# Patient Record
Sex: Female | Born: 1990
Health system: Southern US, Community
[De-identification: ages and names within clinical notes are randomized; demographics above are authoritative.]

## PROBLEM LIST (undated history)

## (undated) DIAGNOSIS — E079 Disorder of thyroid, unspecified: Secondary | ICD-10-CM

## (undated) DIAGNOSIS — F419 Anxiety disorder, unspecified: Secondary | ICD-10-CM

## (undated) DIAGNOSIS — K219 Gastro-esophageal reflux disease without esophagitis: Secondary | ICD-10-CM

## (undated) DIAGNOSIS — R0602 Shortness of breath: Secondary | ICD-10-CM

## (undated) HISTORY — DX: Shortness of breath: R06.02

## (undated) HISTORY — DX: Gastro-esophageal reflux disease without esophagitis: K21.9

## (undated) HISTORY — DX: Disorder of thyroid, unspecified: E07.9

## (undated) HISTORY — DX: Anxiety disorder, unspecified: F41.9

---

## 2020-03-06 ENCOUNTER — Emergency Department
Admission: EM | Admit: 2020-03-06 | Discharge: 2020-03-06 | Disposition: A | Discharge status: Home / Self Care | Payer: BC Managed Care – PPO | Attending: Emergency Medicine | Admitting: Emergency Medicine

## 2020-03-06 ENCOUNTER — Other Ambulatory Visit: Payer: Self-pay

## 2020-03-06 ENCOUNTER — Emergency Department: Payer: BC Managed Care – PPO

## 2020-03-06 ENCOUNTER — Encounter: Payer: Self-pay | Admitting: Emergency Medicine

## 2020-03-06 DIAGNOSIS — U071 COVID-19: Secondary | ICD-10-CM | POA: Diagnosis not present

## 2020-03-06 DIAGNOSIS — J392 Other diseases of pharynx: Secondary | ICD-10-CM | POA: Diagnosis not present

## 2020-03-06 DIAGNOSIS — R059 Cough, unspecified: Secondary | ICD-10-CM | POA: Diagnosis not present

## 2020-03-06 DIAGNOSIS — J351 Hypertrophy of tonsils: Secondary | ICD-10-CM | POA: Diagnosis not present

## 2020-03-06 DIAGNOSIS — J029 Acute pharyngitis, unspecified: Secondary | ICD-10-CM | POA: Diagnosis not present

## 2020-03-06 DIAGNOSIS — J384 Edema of larynx: Secondary | ICD-10-CM | POA: Diagnosis not present

## 2020-03-06 LAB — CBC WITH DIFFERENTIAL/PLATELET
Abs Immature Granulocytes: 0.03 10*3/uL (ref 0.00–0.07)
Basophils Absolute: 0 10*3/uL (ref 0.0–0.1)
Basophils Relative: 0 %
Eosinophils Absolute: 0 10*3/uL (ref 0.0–0.5)
Eosinophils Relative: 0 %
HCT: 40.9 % (ref 36.0–46.0)
Hemoglobin: 13.9 g/dL (ref 12.0–15.0)
Immature Granulocytes: 0 %
Lymphocytes Relative: 21 %
Lymphs Abs: 1.8 10*3/uL (ref 0.7–4.0)
MCH: 27.4 pg (ref 26.0–34.0)
MCHC: 34 g/dL (ref 30.0–36.0)
MCV: 80.5 fL (ref 80.0–100.0)
Monocytes Absolute: 0.9 10*3/uL (ref 0.1–1.0)
Monocytes Relative: 11 %
Neutro Abs: 5.7 10*3/uL (ref 1.7–7.7)
Neutrophils Relative %: 68 %
Platelets: 300 10*3/uL (ref 150–400)
RBC: 5.08 MIL/uL (ref 3.87–5.11)
RDW: 12.9 % (ref 11.5–15.5)
WBC: 8.4 10*3/uL (ref 4.0–10.5)
nRBC: 0 % (ref 0.0–0.2)

## 2020-03-06 LAB — COMPREHENSIVE METABOLIC PANEL
ALT: 43 U/L (ref 0–44)
AST: 41 U/L (ref 15–41)
Albumin: 3.8 g/dL (ref 3.5–5.0)
Alkaline Phosphatase: 97 U/L (ref 38–126)
Anion gap: 15 (ref 5–15)
BUN: 10 mg/dL (ref 6–20)
CO2: 21 mmol/L — ABNORMAL LOW (ref 22–32)
Calcium: 9.3 mg/dL (ref 8.9–10.3)
Chloride: 102 mmol/L (ref 98–111)
Creatinine, Ser: 0.53 mg/dL (ref 0.44–1.00)
GFR, Estimated: >60 mL/min (ref >60)
Glucose, Bld: 91 mg/dL (ref 70–99)
Potassium: 3.4 mmol/L — ABNORMAL LOW (ref 3.5–5.1)
Sodium: 138 mmol/L (ref 135–145)
Total Bilirubin: 0.6 mg/dL (ref 0.3–1.2)
Total Protein: 8.3 g/dL — ABNORMAL HIGH (ref 6.5–8.1)

## 2020-03-06 LAB — RESP PANEL BY RT-PCR (FLU A&B, COVID) ARPGX2
Influenza A by PCR: NEGATIVE
Influenza B by PCR: NEGATIVE
SARS Coronavirus 2 by RT PCR: POSITIVE — AB

## 2020-03-06 LAB — GROUP A STREP BY PCR: Group A Strep by PCR: NOT DETECTED

## 2020-03-06 MED ORDER — LIDOCAINE VISCOUS HCL 2 % MT SOLN
15.0000 mL | Freq: Once | OROMUCOSAL | Status: AC
  Administered 2020-03-06: 20:00:00 15 mL via OROMUCOSAL
  Filled 2020-03-06: qty 15

## 2020-03-06 MED ORDER — DEXAMETHASONE SODIUM PHOSPHATE 10 MG/ML IJ SOLN
10.0000 mg | Freq: Once | INTRAMUSCULAR | Status: AC
  Administered 2020-03-06: 21:00:00 10 mg via INTRAVENOUS
  Filled 2020-03-06: qty 1

## 2020-03-06 MED ORDER — SODIUM CHLORIDE 0.9 % IV BOLUS
1000.0000 mL | Freq: Once | INTRAVENOUS | Status: AC
  Administered 2020-03-06: 21:00:00 1000 mL via INTRAVENOUS

## 2020-03-06 MED ORDER — IOHEXOL 300 MG/ML  SOLN
75.0000 mL | Freq: Once | INTRAMUSCULAR | Status: AC | PRN
  Administered 2020-03-06: 75 mL via INTRAVENOUS
  Filled 2020-03-06: qty 75

## 2020-03-06 NOTE — ED Triage Notes (Signed)
Pt to ED via POV stating that she has severe sore throat x 2 day. Pt also c/o body aches. Pt is in NAD.

## 2020-03-06 NOTE — ED Notes (Signed)
Taken to CT.

## 2020-03-07 NOTE — ED Provider Notes (Signed)
[facility] Emergency Department Provider Note  ____________________________________________   Event Date/Time   First MD Initiated Contact with Patient 03/06/20 1905     (approximate)  I have reviewed the triage vital signs and the nursing notes.   HISTORY  Chief Complaint Sore Throat and Generalized Body Aches  HPI Shannon Newman is a 29 y.o. female who reports to the emergency department for evaluation of severe sore throat for the last 2 days.  Patient also reports body aches.  She denies any fevers, nasal congestion.  Has had a mild dry cough but states that she thinks that this is more related to the pain in her throat.  She has not had any known Covid exposures that she is aware of.  She states that the pain in her throat is so painful that she is having difficulty swallowing due to pain.  She states that she began spitting her saliva into a bag due to the amount of pain with swallowing.  Denies any voice changes.  She has felt that it is more difficult to get a deep breath.  She denies any chest pain, abdominal pain.         History reviewed. No pertinent past medical history.  There are no problems to display for this patient.   History reviewed. No pertinent surgical history.  Prior to Admission medications   Not on File    Allergies Patient has no known allergies.  No family history on file.  Social History Social History   Tobacco Use  . Smoking status: Never Smoker  . Smokeless tobacco: Never Used  Substance Use Topics  . Alcohol use: Not Currently  . Drug use: Not Currently    Review of Systems Constitutional: No fever/chills Eyes: No visual changes. ENT: + sore throat, -voice change Cardiovascular: Denies chest pain. Respiratory: Denies shortness of breath. Gastrointestinal: No abdominal pain.  No nausea, no vomiting.  No diarrhea.  No constipation. Genitourinary: Negative for dysuria. Musculoskeletal: Negative for  back pain. Skin: Negative for rash. Neurological: Negative for headaches, focal weakness or numbness.  ____________________________________________   PHYSICAL EXAM:  VITAL SIGNS: ED Triage Vitals  Enc Vitals Group     BP 03/06/20 1529 106/73     Pulse Rate 03/06/20 1525 100     Resp 03/06/20 1525 16     Temp 03/06/20 1525 98.7 F (37.1 C)     Temp Source 03/06/20 1525 Oral     SpO2 03/06/20 1525 98 %     Weight 03/06/20 1525 230 lb (104.3 kg)     Height 03/06/20 1525 5\' 4"  (1.626 m)     Head Circumference --      Peak Flow --      Pain Score 03/06/20 1525 8     Pain Loc --      Pain Edu? --      Excl. in GC? --    Constitutional: Alert and oriented. Well appearing and in no acute distress. Eyes: Conjunctivae are normal. PERRL. EOMI. Head: Atraumatic. Nose: No congestion/rhinnorhea. Mouth/Throat: Mucous membranes are moist.  Erythematous with mild tonsillar enlargement, 2+.  No exudates noted.  Uvula is midline. Neck: No stridor.   Lymphatic: There is cervical lymphadenopathy present. Cardiovascular: Normal rate, regular rhythm. Grossly normal heart sounds.  Good peripheral circulation. Respiratory: Normal respiratory effort.  No retractions. Lungs CTAB. Gastrointestinal: Soft and nontender. No distention. No abdominal bruits. No CVA tenderness. Musculoskeletal: No lower extremity tenderness nor edema.  No joint effusions.  Neurologic:  Normal speech and language. No gross focal neurologic deficits are appreciated. No gait instability. Skin:  Skin is warm, dry and intact. No rash noted. Psychiatric: Mood and affect are normal. Speech and behavior are normal.  ____________________________________________   LABS (all labs ordered are listed, but only abnormal results are displayed)  Labs Reviewed  RESP PANEL BY RT-PCR (FLU A&B, COVID) ARPGX2 - Abnormal; Notable for the following components:      Result Value   SARS Coronavirus 2 by RT PCR POSITIVE (*)    All other  components within normal limits  COMPREHENSIVE METABOLIC PANEL - Abnormal; Notable for the following components:   Potassium 3.4 (*)    CO2 21 (*)    Total Protein 8.3 (*)    All other components within normal limits  GROUP A STREP BY PCR  CBC WITH DIFFERENTIAL/PLATELET    ____________________________________________  RADIOLOGY Susa Raring, personally viewed and evaluated these images (plain radiographs) as part of my medical decision making, as well as reviewing the written report by the radiologist.  ED provider interpretation: Chest x-ray reveals no focal pneumonia, see radiology report for CT tissue of the neck  Official radiology report(s): CT Soft Tissue Neck W Contrast  Result Date: 03/06/2020 CLINICAL DATA:  Sore throat for 2 days. EXAM: CT NECK WITH CONTRAST TECHNIQUE: Multidetector CT imaging of the neck was performed using the standard protocol following the bolus administration of intravenous contrast. CONTRAST:  30mL OMNIPAQUE IOHEXOL 300 MG/ML  SOLN COMPARISON:  None. FINDINGS: Pharynx and larynx: Mild bilateral enlargement/hyperplasia of the palatine and lingual tonsils. Mild symmetric submucosal edema in the hypopharynx and supraglottic larynx. Patent airway. No fluid collection or inflammatory changes in the parapharyngeal or retropharyngeal spaces. Salivary glands: No inflammation, mass, or stone. Thyroid: Moderate diffuse homogeneous enlargement of the thyroid gland for which no follow-up imaging is recommended. Lymph nodes: Enlarged level II a lymph nodes measuring up to 1.8 cm in short axis bilaterally. Borderline enlarged level IB lymph nodes measuring up to 1.0 cm bilaterally. Vascular: Major vascular structures of the neck are grossly patent. Limited intracranial: Unremarkable. Visualized orbits: Unremarkable. Mastoids and visualized paranasal sinuses: Minimal right ethmoid air cell mucosal thickening. Clear mastoid air cells. Skeleton: No acute osseous  abnormality or suspicious osseous lesion. Upper chest: Clear lung apices. Other: None. IMPRESSION: 1. Mild submucosal edema in the hypopharynx and supraglottic larynx which may reflect mild pharyngitis/laryngitis. No abscess. 2. Mild bilateral cervical lymphadenopathy, likely reactive. Electronically Signed   By: Sebastian Ache M.D.   On: 03/06/2020 21:52   DG Chest Portable 1 View  Result Date: 03/06/2020 CLINICAL DATA:  COVID positive with sore throat and body aches. EXAM: PORTABLE CHEST 1 VIEW COMPARISON:  None. FINDINGS: The heart size and mediastinal contours are within normal limits. Both lungs are clear. The visualized skeletal structures are unremarkable. IMPRESSION: No active disease. Electronically Signed   By: Aram Candela M.D.   On: 03/06/2020 20:14    ____________________________________________   INITIAL IMPRESSION / ASSESSMENT AND PLAN / ED COURSE  As part of my medical decision making, I reviewed the following data within the electronic MEDICAL RECORD NUMBER Nursing notes reviewed and incorporated, Labs reviewed and Radiograph reviewed        Patient is a 29 year old female presents to the emergency department for evaluation of significant sore throat with difficulty swallowing that has been progressive over the last 2 days.  Patient has not had a fever.  See HPI for further details.  On physical exam, the patient does have an erythematous throat with mild tonsillar enlargement, but uvula is midline and no unilateral swelling to suggest PTA.  The patient is complaining of some mild shortness of breath, but auscultation is within normal limits.  Initial work-up included respiratory panel and strep swab.  Patient is positive for Covid but negative for flu or strep.  Chest x-ray was also obtained which reveals no focal pneumonia.  Attempted to medicate the patient with lidocaine viscous mouth solution in an attempt to treat her sore throat, however the patient began swallowing it and  quickly spit it up, saying that she was unable to swallow completely.  Given the difficulty of this, further work-up was obtained to evaluate for other cause of her symptoms.  CBC and CMP are grossly within normal limits.  CT soft tissue of the neck was obtained and does not reveal any peritonsillar abscess or other deep space neck infection.  Patient was also given Decadron for symptom management.  Per [facility] criteria, patient meets 2/5 of the criteria and thus does not warrant further treatment with a negative strep swab.  Patient was instructed that should she experience any worsening, she should be reevaluated.  Supportive care was discussed for patient's Covid positive status and she is amenable to this plan.  She will return for any acute worsening or follow-up with primary care as scheduled.      ____________________________________________   FINAL CLINICAL IMPRESSION(S) / ED DIAGNOSES  Final diagnoses:  COVID  Pharyngitis, unspecified etiology     ED Discharge Orders    None      *Please note:  Maeley Matton was evaluated in Emergency Department on 03/07/2020 for the symptoms described in the history of present illness. She was evaluated in the context of the global COVID-19 pandemic, which necessitated consideration that the patient might be at risk for infection with the SARS-CoV-2 virus that causes COVID-19. Institutional protocols and algorithms that pertain to the evaluation of patients at risk for COVID-19 are in a state of rapid change based on information released by regulatory bodies including the CDC and federal and state organizations. These policies and algorithms were followed during the patient's care in the ED.  Some ED evaluations and interventions may be delayed as a result of limited staffing during and the pandemic.*   Note:  This document was prepared using Dragon voice recognition software and may include unintentional dictation errors.    Lucy Chris,  PA 03/07/20 1707    [place]ton [place], MD 03/08/20 Marlyne Beards

## 2020-05-26 ENCOUNTER — Encounter: Payer: Self-pay | Admitting: Family Medicine

## 2020-05-26 ENCOUNTER — Ambulatory Visit: Payer: BC Managed Care – PPO | Admitting: Family Medicine

## 2020-05-26 ENCOUNTER — Other Ambulatory Visit: Payer: Self-pay

## 2020-05-26 VITALS — BP 132/84 | HR 86 | Ht 64.0 in | Wt 221.4 lb

## 2020-05-26 DIAGNOSIS — R000 Tachycardia, unspecified: Secondary | ICD-10-CM | POA: Insufficient documentation

## 2020-05-26 DIAGNOSIS — E8881 Metabolic syndrome: Secondary | ICD-10-CM | POA: Diagnosis not present

## 2020-05-26 DIAGNOSIS — Z7689 Persons encountering health services in other specified circumstances: Secondary | ICD-10-CM

## 2020-05-26 LAB — GLUCOSE, POCT (MANUAL RESULT ENTRY): POC Glucose: 163 mg/dl — AB (ref 70–99)

## 2020-05-26 LAB — POCT GLYCOSYLATED HEMOGLOBIN (HGB A1C): HbA1c POC (<> result, manual entry): 4.7 % (ref 4.0–5.6)

## 2020-05-26 MED ORDER — METFORMIN HCL ER 500 MG PO TB24: 1000.0000 mg | ORAL_TABLET | Freq: Every day | ORAL | 5 refills | Status: DC

## 2020-05-26 MED ORDER — ATENOLOL 25 MG PO TABS: 25.0000 mg | ORAL_TABLET | Freq: Every day | ORAL | 3 refills | Status: DC

## 2020-05-26 NOTE — Assessment & Plan Note (Signed)
Patient in to establish care with new provider, says that overall she is doing well taking all meds. She has been out of her Atenolol 25 mg QD for her tachycardia. Denies pain.

## 2020-05-26 NOTE — Progress Notes (Signed)
Established Patient Office Visit  SUBJECTIVE:  Subjective  Patient ID: Shannon Newman, female    DOB: 03/20/1990  Age: 30 y.o. MRN: [id number]  CC:  Chief Complaint  Patient presents with  . New Patient (Initial Visit)    HPI Shannon Newman is a 30 y.o. female presenting today for     Past Medical History:  Diagnosis Date  . Shortness of breath     History reviewed. No pertinent surgical history.  Family History  Problem Relation Age of Onset  . Cancer Maternal Grandmother   . Diabetes Maternal Grandmother   . Heart disease Maternal Grandmother     Social History   Socioeconomic History  . Marital status: Single    Spouse name: Not on file  . Number of children: Not on file  . Years of education: Not on file  . Highest education level: Not on file  Occupational History  . Not on file  Tobacco Use  . Smoking status: Never Smoker  . Smokeless tobacco: Never Used  Substance and Sexual Activity  . Alcohol use: Not Currently  . Drug use: Not Currently  . Sexual activity: Yes  Other Topics Concern  . Not on file  Social History Narrative  . Not on file   Social Determinants of Health   Financial Resource Strain: Not on file  Food Insecurity: Not on file  Transportation Needs: Not on file  Physical Activity: Not on file  Stress: Not on file  Social Connections: Not on file  Intimate Partner Violence: Not on file     Current Outpatient Medications:  .  Biotin 1 MG CAPS, Take by mouth daily at 2 PM., Disp: , Rfl:  .  cholecalciferol (VITAMIN D3) 25 MCG (1000 UNIT) tablet, Take 1,000 Units by mouth daily., Disp: , Rfl:  .  atenolol (TENORMIN) 25 MG tablet, Take 1 tablet (25 mg total) by mouth daily., Disp: 30 tablet, Rfl: 3 .  metFORMIN (GLUCOPHAGE-XR) 500 MG 24 hr tablet, Take 2 tablets (1,000 mg total) by mouth daily., Disp: 60 tablet, Rfl: 5   No Known Allergies  ROS Review of Systems  Constitutional: Negative.   HENT: Negative.   Respiratory:  Negative.   Cardiovascular: Negative for chest pain, palpitations and leg swelling.  Gastrointestinal: Negative.   Genitourinary: Negative.   Neurological: Negative.   Psychiatric/Behavioral: Negative.      OBJECTIVE:    Physical Exam Constitutional:      Appearance: She is obese.  HENT:     Right Ear: Tympanic membrane normal.     Left Ear: Tympanic membrane normal.     Mouth/Throat:     Mouth: Mucous membranes are moist.  Cardiovascular:     Rate and Rhythm: Regular rhythm. Tachycardia present.     Heart sounds: Normal heart sounds. No murmur heard.   Musculoskeletal:        General: Normal range of motion.  Skin:    General: Skin is warm.     BP 132/84   Pulse 86   Ht 5\' 4"  (1.626 m)   Wt 221 lb 6.4 oz (100.4 kg)   BMI 38.00 kg/m  Wt Readings from Last 3 Encounters:  05/26/20 221 lb 6.4 oz (100.4 kg)  03/06/20 230 lb (104.3 kg)    Health Maintenance Due  Topic Date Due  . Hepatitis C Screening  Never done  . COVID-19 Vaccine (1) Never done  . HIV Screening  Never done  . TETANUS/TDAP  Never done  .  PAP-Cervical Cytology Screening  Never done  . PAP SMEAR-Modifier  Never done  . INFLUENZA VACCINE  Never done    There are no preventive care reminders to display for this patient.  CBC Latest Ref Rng & Units 03/06/2020  WBC 4.0 - 10.5 K/uL 8.4  Hemoglobin 12.0 - 15.0 g/dL 16.1  Hematocrit 09.6 - 46.0 % 40.9  Platelets 150 - 400 K/uL 300   CMP Latest Ref Rng & Units 03/06/2020  Glucose 70 - 99 mg/dL 91  BUN 6 - 20 mg/dL 10  Creatinine 0.45 - 4.09 mg/dL 8.11  Sodium 914 - 782 mmol/L 138  Potassium 3.5 - 5.1 mmol/L 3.4(L)  Chloride 98 - 111 mmol/L 102  CO2 22 - 32 mmol/L 21(L)  Calcium 8.9 - 10.3 mg/dL 9.3  Total Protein 6.5 - 8.1 g/dL 8.3(H)  Total Bilirubin 0.3 - 1.2 mg/dL 0.6  Alkaline Phos 38 - 126 U/L 97  AST 15 - 41 U/L 41  ALT 0 - 44 U/L 43    No results found for: TSH Lab Results  Component Value Date   ALBUMIN 3.8 03/06/2020    ANIONGAP 15 03/06/2020   No results found for: CHOL, HDL, LDLCALC, CHOLHDL No results found for: TRIG Lab Results  Component Value Date   HGBA1C 4.7 05/26/2020      ASSESSMENT & PLAN:   Problem List Items Addressed This Visit      Endocrine   Insulin resistance - Primary    Last A1C 4.7, says her diet has not been great. She is taking Metformin 500 mg bid. She plans to eat better and exercise. Will validate this A1C with send out lab A1C.       Relevant Orders   POCT glucose (manual entry) (Completed)   POCT HgB A1C (Completed)     Other   Encounter to establish care    Patient in to establish care with new provider, says that overall she is doing well taking all meds. She has been out of her Atenolol 25 mg QD for her tachycardia. Denies pain.       Tachycardia    Patient with 120-130 HR today, she has been out of her Atenolol 25 mg for some time now 1 week. Denies SOB or CP   Plan- Return in one month to see Dr Juel Burrow for evaluation of Tachycardia.          Meds ordered this encounter  Medications  . atenolol (TENORMIN) 25 MG tablet    Sig: Take 1 tablet (25 mg total) by mouth daily.    Dispense:  30 tablet    Refill:  3  . metFORMIN (GLUCOPHAGE-XR) 500 MG 24 hr tablet    Sig: Take 2 tablets (1,000 mg total) by mouth daily.    Dispense:  60 tablet    Refill:  5      Follow-up: No follow-ups on file.    Irish Lack, FNP [facility] [address], [place], [place] [address]

## 2020-05-26 NOTE — Assessment & Plan Note (Signed)
Patient with 120-130 HR today, she has been out of her Atenolol 25 mg for some time now 1 week. Denies SOB or CP   Plan- Return in one month to see Dr Juel Burrow for evaluation of Tachycardia.

## 2020-05-26 NOTE — Assessment & Plan Note (Signed)
Last A1C 4.7, says her diet has not been great. She is taking Metformin 500 mg bid. She plans to eat better and exercise. Will validate this A1C with send out lab A1C.

## 2020-06-27 ENCOUNTER — Ambulatory Visit (INDEPENDENT_AMBULATORY_CARE_PROVIDER_SITE_OTHER): Payer: BC Managed Care – PPO | Admitting: Internal Medicine

## 2020-06-27 ENCOUNTER — Other Ambulatory Visit: Payer: Self-pay

## 2020-06-27 ENCOUNTER — Encounter: Payer: Self-pay | Admitting: Internal Medicine

## 2020-06-27 VITALS — BP 128/82 | HR 101 | Ht 64.0 in | Wt 224.0 lb

## 2020-06-27 DIAGNOSIS — Z6838 Body mass index (BMI) 38.0-38.9, adult: Secondary | ICD-10-CM

## 2020-06-27 DIAGNOSIS — E669 Obesity, unspecified: Secondary | ICD-10-CM | POA: Insufficient documentation

## 2020-06-27 DIAGNOSIS — E040 Nontoxic diffuse goiter: Secondary | ICD-10-CM

## 2020-06-27 DIAGNOSIS — E66812 Obesity, class 2: Secondary | ICD-10-CM | POA: Insufficient documentation

## 2020-06-27 DIAGNOSIS — R000 Tachycardia, unspecified: Secondary | ICD-10-CM

## 2020-06-27 DIAGNOSIS — E8881 Metabolic syndrome: Secondary | ICD-10-CM | POA: Diagnosis not present

## 2020-06-27 NOTE — Assessment & Plan Note (Signed)
Patient was advised to follow [facility] diet to lose weight.

## 2020-06-27 NOTE — Assessment & Plan Note (Signed)

## 2020-06-27 NOTE — Progress Notes (Signed)
Established Patient Office Visit  Subjective:  Patient ID: Shannon Newman, female    DOB: Sep 09, 1990  Age: 30 y.o. MRN: [id number]  CC:  Chief Complaint  Patient presents with  . Follow-up    Shortness of Breath This is a chronic problem. The problem has been unchanged. Associated symptoms include headaches. Pertinent negatives include no abdominal pain, chest pain, claudication, fever, hemoptysis, leg pain, leg swelling, neck pain, orthopnea, PND, rash, sputum production, syncope or wheezing. The patient has no known risk factors for DVT/PE. There is no history of asthma or pneumonia.    Shannon Newman presents for tachycardia, no pnd  Past Medical History:  Diagnosis Date  . Shortness of breath     History reviewed. No pertinent surgical history.  Family History  Problem Relation Age of Onset  . Cancer Maternal Grandmother   . Diabetes Maternal Grandmother   . Heart disease Maternal Grandmother     Social History   Socioeconomic History  . Marital status: Single    Spouse name: Not on file  . Number of children: Not on file  . Years of education: Not on file  . Highest education level: Not on file  Occupational History  . Not on file  Tobacco Use  . Smoking status: Never Smoker  . Smokeless tobacco: Never Used  Substance and Sexual Activity  . Alcohol use: Not Currently  . Drug use: Not Currently  . Sexual activity: Yes  Other Topics Concern  . Not on file  Social History Narrative  . Not on file   Social Determinants of Health   Financial Resource Strain: Not on file  Food Insecurity: Not on file  Transportation Needs: Not on file  Physical Activity: Not on file  Stress: Not on file  Social Connections: Not on file  Intimate Partner Violence: Not on file     Current Outpatient Medications:  .  atenolol (TENORMIN) 25 MG tablet, Take 1 tablet (25 mg total) by mouth daily., Disp: 30 tablet, Rfl: 3 .  Biotin 1 MG CAPS, Take by mouth daily at 2 PM.,  Disp: , Rfl:  .  cholecalciferol (VITAMIN D3) 25 MCG (1000 UNIT) tablet, Take 1,000 Units by mouth daily., Disp: , Rfl:  .  metFORMIN (GLUCOPHAGE-XR) 500 MG 24 hr tablet, Take 2 tablets (1,000 mg total) by mouth daily., Disp: 60 tablet, Rfl: 5   No Known Allergies  ROS Review of Systems  Constitutional: Negative.  Negative for fever.  HENT: Negative.        Goiter lt side  Eyes: Negative.   Respiratory: Positive for shortness of breath. Negative for cough, hemoptysis, sputum production, choking, chest tightness and wheezing.   Cardiovascular: Negative.  Negative for chest pain, orthopnea, claudication, leg swelling, syncope and PND.  Gastrointestinal: Negative.  Negative for abdominal pain.  Endocrine: Negative.   Genitourinary: Negative.   Musculoskeletal: Negative.  Negative for neck pain.  Skin: Negative.  Negative for rash.  Allergic/Immunologic: Negative.   Neurological: Positive for headaches. Negative for seizures.  Hematological: Negative.   Psychiatric/Behavioral: Positive for sleep disturbance. Negative for agitation and suicidal ideas. The patient is not nervous/anxious.   All other systems reviewed and are negative.     Objective:   On physical examination patient is a well-nourished in no acute distress HEENT examination is normal neck is supple.  Jugular venous pressure is not elevated Thyroid is not enlarged without any goiter or nodules. No lymphadenopathy is noted. Cardiovascular system examination revealed apical impulse to  be palpable in the fifth intercostal space first heart sounds normal second heart sounds normal no murmur is audible.  On examination of the chest chest is clear on auscultation resonant on percussion.  Abdominal examination reveals abdomen to be soft nontender there is no hepatosplenomegaly bowel sounds are audible no tenderness rigidity or rebound tenderness noted. There is no calf tenderness or pedal edema. Neurological examination is un  remarkable psychiatric examination is unremarkable. Locomotor system examination is unremarkable   BP 128/82   Pulse (!) 101   Ht 5\' 4"  (1.626 m)   Wt 224 lb (101.6 kg)   BMI 38.45 kg/m  Wt Readings from Last 3 Encounters:  06/27/20 224 lb (101.6 kg)  05/26/20 221 lb 6.4 oz (100.4 kg)  03/06/20 230 lb (104.3 kg)     Health Maintenance Due  Topic Date Due  . Hepatitis C Screening  Never done  . COVID-19 Vaccine (1) Never done  . HIV Screening  Never done  . TETANUS/TDAP  Never done  . PAP-Cervical Cytology Screening  Never done  . PAP SMEAR-Modifier  Never done    There are no preventive care reminders to display for this patient.  No results found for: TSH Lab Results  Component Value Date   WBC 8.4 03/06/2020   HGB 13.9 03/06/2020   HCT 40.9 03/06/2020   MCV 80.5 03/06/2020   PLT 300 03/06/2020   Lab Results  Component Value Date   NA 138 03/06/2020   K 3.4 (L) 03/06/2020   CO2 21 (L) 03/06/2020   GLUCOSE 91 03/06/2020   BUN 10 03/06/2020   CREATININE 0.53 03/06/2020   BILITOT 0.6 03/06/2020   ALKPHOS 97 03/06/2020   AST 41 03/06/2020   ALT 43 03/06/2020   PROT 8.3 (H) 03/06/2020   ALBUMIN 3.8 03/06/2020   CALCIUM 9.3 03/06/2020   ANIONGAP 15 03/06/2020   No results found for: CHOL No results found for: HDL No results found for: LDLCALC No results found for: TRIG No results found for: CHOLHDL Lab Results  Component Value Date   HGBA1C 4.7 05/26/2020      Assessment & Plan:   Problem List Items Addressed This Visit      Endocrine   Insulin resistance    Patient was advised to follow [facility] diet to lose weight.      Goiter diffuse, nontoxic    Patient will follow up in [facility] for the goiter if you want to follow this up with here in [place] we can give her some names.        Other   Tachycardia - Primary    I will get an echocardiogram and a stress test to further evaluate the cardiopulmonary function.  She was advised to  lose weight.  We might change the beta-blocker to metoprolol after the stress test.  Patient was advised to do aerobic exercises.      Relevant Orders   EKG 12-Lead   Class 2 obesity without serious comorbidity with body mass index (BMI) of 38.0 to 38.9 in adult    - I encouraged the patient to lose weight.  - I educated them on making healthy dietary choices including eating more fruits and vegetables and less fried foods. - I encouraged the patient to exercise more, and educated on the benefits of exercise including weight loss, diabetes prevention, and hypertension prevention.   Dietary counseling with a registered dietician  Referral to a weight management support group (e.g. Weight Watchers, Overeaters  Anonymous)  If your BMI is greater than 29 or you have gained more than 15 pounds you should work on weight loss.  Attend a healthy cooking class        Report of the electrocardiogram. Normal sinus rhythm no acute changes were noted.  No orders of the defined types were placed in this encounter.   Follow-up: No follow-ups on file.    [name], MD

## 2020-06-27 NOTE — Assessment & Plan Note (Signed)
Patient will follow up in [facility] for the goiter if you want to follow this up with here in [place] we can give her some names.

## 2020-06-27 NOTE — Assessment & Plan Note (Signed)
I will get an echocardiogram and a stress test to further evaluate the cardiopulmonary function.  She was advised to lose weight.  We might change the beta-blocker to metoprolol after the stress test.  Patient was advised to do aerobic exercises.

## 2020-06-30 ENCOUNTER — Other Ambulatory Visit (INDEPENDENT_AMBULATORY_CARE_PROVIDER_SITE_OTHER): Payer: BC Managed Care – PPO

## 2020-06-30 ENCOUNTER — Other Ambulatory Visit: Payer: Self-pay

## 2020-06-30 DIAGNOSIS — Z7689 Persons encountering health services in other specified circumstances: Secondary | ICD-10-CM | POA: Diagnosis not present

## 2020-07-01 LAB — COMPLETE METABOLIC PANEL WITH GFR
AG Ratio: 1.3 (calc) (ref 1.0–2.5)
ALT: 35 U/L — ABNORMAL HIGH (ref 6–29)
AST: 25 U/L (ref 10–30)
Albumin: 3.7 g/dL (ref 3.6–5.1)
Alkaline phosphatase (APISO): 119 U/L (ref 31–125)
BUN/Creatinine Ratio: 27 (calc) — ABNORMAL HIGH (ref 6–22)
BUN: 9 mg/dL (ref 7–25)
CO2: 21 mmol/L (ref 20–32)
Calcium: 9.9 mg/dL (ref 8.6–10.2)
Chloride: 104 mmol/L (ref 98–110)
Creat: 0.33 mg/dL — ABNORMAL LOW (ref 0.50–1.10)
GFR, Est African American: 176 mL/min/{1.73_m2} (ref >=60)
GFR, Est Non African American: 152 mL/min/{1.73_m2} (ref >=60)
Globulin: 2.9 g/dL (calc) (ref 1.9–3.7)
Glucose, Bld: 94 mg/dL (ref 65–99)
Potassium: 3.9 mmol/L (ref 3.5–5.3)
Sodium: 139 mmol/L (ref 135–146)
Total Bilirubin: 0.3 mg/dL (ref 0.2–1.2)
Total Protein: 6.6 g/dL (ref 6.1–8.1)

## 2020-07-01 LAB — CBC WITH DIFFERENTIAL/PLATELET
Absolute Monocytes: 719 cells/uL (ref 200–950)
Basophils Absolute: 24 cells/uL (ref 0–200)
Basophils Relative: 0.3 %
Eosinophils Absolute: 324 cells/uL (ref 15–500)
Eosinophils Relative: 4.1 %
HCT: 39.1 % (ref 35.0–45.0)
Hemoglobin: 12.9 g/dL (ref 11.7–15.5)
Lymphs Abs: 2639 cells/uL (ref 850–3900)
MCH: 27 pg (ref 27.0–33.0)
MCHC: 33 g/dL (ref 32.0–36.0)
MCV: 82 fL (ref 80.0–100.0)
MPV: 9.5 fL (ref 7.5–12.5)
Monocytes Relative: 9.1 %
Neutro Abs: 4195 cells/uL (ref 1500–7800)
Neutrophils Relative %: 53.1 %
Platelets: 384 10*3/uL (ref 140–400)
RBC: 4.77 10*6/uL (ref 3.80–5.10)
RDW: 13.1 % (ref 11.0–15.0)
Total Lymphocyte: 33.4 %
WBC: 7.9 10*3/uL (ref 3.8–10.8)

## 2020-07-01 LAB — LIPID PANEL
Cholesterol: 124 mg/dL (ref <200)
HDL: 44 mg/dL — ABNORMAL LOW (ref >=50)
LDL Cholesterol (Calc): 54 mg/dL (calc)
Non-HDL Cholesterol (Calc): 80 mg/dL (calc) (ref <130)
Total CHOL/HDL Ratio: 2.8 (calc) (ref <5.0)
Triglycerides: 187 mg/dL — ABNORMAL HIGH (ref <150)

## 2020-07-01 LAB — TSH: TSH: <0.01 mIU/L — ABNORMAL LOW

## 2020-07-04 ENCOUNTER — Other Ambulatory Visit (INDEPENDENT_AMBULATORY_CARE_PROVIDER_SITE_OTHER): Payer: BC Managed Care – PPO

## 2020-07-04 ENCOUNTER — Other Ambulatory Visit: Payer: Self-pay

## 2020-07-04 DIAGNOSIS — R0602 Shortness of breath: Secondary | ICD-10-CM | POA: Insufficient documentation

## 2020-07-04 DIAGNOSIS — E669 Obesity, unspecified: Secondary | ICD-10-CM

## 2020-07-04 DIAGNOSIS — E040 Nontoxic diffuse goiter: Secondary | ICD-10-CM | POA: Diagnosis not present

## 2020-07-04 DIAGNOSIS — R000 Tachycardia, unspecified: Secondary | ICD-10-CM

## 2020-07-04 DIAGNOSIS — Z6838 Body mass index (BMI) 38.0-38.9, adult: Secondary | ICD-10-CM | POA: Diagnosis not present

## 2020-07-04 NOTE — Assessment & Plan Note (Signed)
Referred to endocrine specialist

## 2020-07-04 NOTE — Progress Notes (Unsigned)
Established Patient Office Visit  Subjective:  Patient ID: Shannon Newman, female    DOB: [birth year]  Age: 30 y.o. MRN: [id number]  CC: No chief complaint on file.   HPI  [name] presents forpalpitation  Past Medical History:  Diagnosis Date  . Shortness of breath     No past surgical history on file.  Family History  Problem Relation Age of Onset  . Cancer Maternal Grandmother   . Diabetes Maternal Grandmother   . Heart disease Maternal Grandmother     Social History   Socioeconomic History  . Marital status: Single    Spouse name: Not on file  . Number of children: Not on file  . Years of education: Not on file  . Highest education level: Not on file  Occupational History  . Not on file  Tobacco Use  . Smoking status: Never Smoker  . Smokeless tobacco: Never Used  Substance and Sexual Activity  . Alcohol use: Not Currently  . Drug use: Not Currently  . Sexual activity: Yes  Other Topics Concern  . Not on file  Social History Narrative  . Not on file   Social Determinants of Health   Financial Resource Strain: Not on file  Food Insecurity: Not on file  Transportation Needs: Not on file  Physical Activity: Not on file  Stress: Not on file  Social Connections: Not on file  Intimate Partner Violence: Not on file     Current Outpatient Medications:  .  atenolol (TENORMIN) 25 MG tablet, Take 1 tablet (25 mg total) by mouth daily., Disp: 30 tablet, Rfl: 3 .  Biotin 1 MG CAPS, Take by mouth daily at 2 PM., Disp: , Rfl:  .  cholecalciferol (VITAMIN D3) 25 MCG (1000 UNIT) tablet, Take 1,000 Units by mouth daily., Disp: , Rfl:  .  metFORMIN (GLUCOPHAGE-XR) 500 MG 24 hr tablet, Take 2 tablets (1,000 mg total) by mouth daily., Disp: 60 tablet, Rfl: 5   No Known Allergies  ROS Review of Systems  Constitutional: Negative.   HENT: Negative.   Eyes: Negative.   Respiratory: Positive for shortness of breath.   Cardiovascular: Positive for  palpitations.  Gastrointestinal: Negative.  Negative for blood in stool and constipation.  Endocrine: Negative.   Genitourinary: Negative.   Musculoskeletal: Negative.   Skin: Negative.   Allergic/Immunologic: Negative.   Neurological: Negative.   Hematological: Negative.   Psychiatric/Behavioral: Negative.   All other systems reviewed and are negative.     Objective:    Physical Exam Vitals reviewed.  Constitutional:      Appearance: Normal appearance.  HENT:     Mouth/Throat:     Mouth: Mucous membranes are moist.  Eyes:     Pupils: Pupils are equal, round, and reactive to light.  Neck:     Vascular: No carotid bruit.  Cardiovascular:     Rate and Rhythm: Normal rate and regular rhythm.     Pulses: Normal pulses.     Heart sounds: Normal heart sounds.  Pulmonary:     Effort: Pulmonary effort is normal.     Breath sounds: Normal breath sounds.  Abdominal:     General: Bowel sounds are normal.     Palpations: Abdomen is soft. There is no hepatomegaly, splenomegaly or mass.     Tenderness: There is no abdominal tenderness.     Hernia: No hernia is present.  Musculoskeletal:        General: No tenderness.     Cervical back:  Neck supple.     Right lower leg: No edema.     Left lower leg: No edema.  Skin:    Findings: No rash.  Neurological:     Mental Status: She is alert and oriented to person, place, and time.     Motor: No weakness.  Psychiatric:        Mood and Affect: Mood and affect normal.        Behavior: Behavior normal.     There were no vitals taken for this visit. Wt Readings from Last 3 Encounters:  06/27/20 224 lb (101.6 kg)  05/26/20 221 lb 6.4 oz (100.4 kg)  03/06/20 230 lb (104.3 kg)     Health Maintenance Due  Topic Date Due  . Hepatitis C Screening  Never done  . COVID-19 Vaccine (1) Never done  . HIV Screening  Never done  . TETANUS/TDAP  Never done  . PAP-Cervical Cytology Screening  Never done  . PAP SMEAR-Modifier  Never done     There are no preventive care reminders to display for this patient.  Lab Results  Component Value Date   TSH <0.01 (L) 06/30/2020   Lab Results  Component Value Date   WBC 7.9 06/30/2020   HGB 12.9 06/30/2020   HCT 39.1 06/30/2020   MCV 82.0 06/30/2020   PLT 384 06/30/2020   Lab Results  Component Value Date   NA 139 06/30/2020   K 3.9 06/30/2020   CO2 21 06/30/2020   GLUCOSE 94 06/30/2020   BUN 9 06/30/2020   CREATININE 0.33 (L) 06/30/2020   BILITOT 0.3 06/30/2020   ALKPHOS 97 03/06/2020   AST 25 06/30/2020   ALT 35 (H) 06/30/2020   PROT 6.6 06/30/2020   ALBUMIN 3.8 03/06/2020   CALCIUM 9.9 06/30/2020   ANIONGAP 15 03/06/2020   Lab Results  Component Value Date   CHOL 124 06/30/2020   Lab Results  Component Value Date   HDL 44 (L) 06/30/2020   Lab Results  Component Value Date   LDLCALC 54 06/30/2020   Lab Results  Component Value Date   TRIG 187 (H) 06/30/2020   Lab Results  Component Value Date   CHOLHDL 2.8 06/30/2020   Lab Results  Component Value Date   HGBA1C 4.7 05/26/2020      Assessment & Plan:  Report of the echocardiogram  [facility] [address] [place], [place] [address] Phone: [phone] Fax:  [phone]  Transthoracic Echocardiogram Note  Adella Manolis [id number] 1990-05-24  Procedure: Transthoracic Echocardiogram Indications: Tachycardia shortness of breath Verbal Consent: Obtained  Procedure Details two-dimensional M-mode echocardiogram was obtained in the long axis short axis and apical four-chamber view.  Echocardiogram is technically difficult because of exogenous obesity and artifact.  Mitral and aortic valve is normal left ventricular size appeared to be normal there is no pericardial effusion.  No blood clot was noted.  In the left atrial cavity.  Technical quality: good  Resting Measurements: Visit number limits  Left Ventrical: Appears to be normal  Mitral Valve:  Normal  Aortic Valve: Normal  Tricuspid Valve: Normal  Pulmonic Valve: Not seen  Left Atrium/ Left atrial appendage: Normal  Atrial septum:   Aorta: Normal   Complications: No apparent complications Patient did tolerate procedure well.  [name], MD      Problem List Items Addressed This Visit      Endocrine   Goiter diffuse, nontoxic    Referred to endocrine specialist  Other   Tachycardia    She complains of palpitation to 3 times a week.  I will get a 24-hour Holter monitor to evaluate the arrhythmia.  She was advised to drink a lot of water and follow a low carbohydrate diet.      Class 2 obesity without serious comorbidity with body mass index (BMI) of 38.0 to 38.9 in adult - Primary    - I encouraged the patient to lose weight.  - I educated them on making healthy dietary choices including eating more fruits and vegetables and less fried foods. - I encouraged the patient to exercise more, and educated on the benefits of exercise including weight loss, diabetes prevention, and hypertension prevention.   Dietary counseling with a registered dietician  Referral to a weight management support group (e.g. Weight Watchers, Overeaters Anonymous)  If your BMI is greater than 29 or you have gained more than 15 pounds you should work on weight loss.  Attend a healthy cooking class       Shortness of breath    Patient shortness of breath is suggestive of asthma, will refer him to pulmonologist for evaluation.  Patient was advised to lose weight walk on a daily basis         No orders of the defined types were placed in this encounter.   Follow-up: No follow-ups on file.    [name], MD

## 2020-07-04 NOTE — Assessment & Plan Note (Signed)

## 2020-07-04 NOTE — Assessment & Plan Note (Signed)
She complains of palpitation to 3 times a week.  I will get a 24-hour Holter monitor to evaluate the arrhythmia.  She was advised to drink a lot of water and follow a low carbohydrate diet.

## 2020-07-04 NOTE — Assessment & Plan Note (Signed)
Patient shortness of breath is suggestive of asthma, will refer him to pulmonologist for evaluation.  Patient was advised to lose weight walk on a daily basis

## 2020-07-06 ENCOUNTER — Encounter: Payer: BC Managed Care – PPO | Admitting: Internal Medicine

## 2020-07-06 ENCOUNTER — Other Ambulatory Visit: Payer: Self-pay

## 2020-07-12 ENCOUNTER — Encounter: Payer: Self-pay | Admitting: Internal Medicine

## 2020-07-12 ENCOUNTER — Other Ambulatory Visit: Payer: Self-pay

## 2020-07-12 ENCOUNTER — Ambulatory Visit: Payer: BC Managed Care – PPO | Admitting: Internal Medicine

## 2020-07-12 VITALS — BP 128/84 | HR 102 | Ht 64.0 in | Wt 220.4 lb

## 2020-07-12 DIAGNOSIS — E669 Obesity, unspecified: Secondary | ICD-10-CM | POA: Diagnosis not present

## 2020-07-12 DIAGNOSIS — E040 Nontoxic diffuse goiter: Secondary | ICD-10-CM | POA: Diagnosis not present

## 2020-07-12 DIAGNOSIS — R0602 Shortness of breath: Secondary | ICD-10-CM

## 2020-07-12 DIAGNOSIS — Z6838 Body mass index (BMI) 38.0-38.9, adult: Secondary | ICD-10-CM | POA: Diagnosis not present

## 2020-07-12 DIAGNOSIS — R000 Tachycardia, unspecified: Secondary | ICD-10-CM

## 2020-07-12 NOTE — Progress Notes (Signed)
Established Patient Office Visit  Subjective:  Patient ID: Shannon Newman, female    DOB: [birth year]  Age: 30 y.o. MRN: [id number]  CC:  Chief Complaint  Patient presents with  . holter results    HPI  Shannon Newman presents for report of the Holter monitor Past Medical History:  Diagnosis Date  . Shortness of breath     No past surgical history on file.  Family History  Problem Relation Age of Onset  . Cancer Maternal Grandmother   . Diabetes Maternal Grandmother   . Heart disease Maternal Grandmother     Social History   Socioeconomic History  . Marital status: Single    Spouse name: Not on file  . Number of children: Not on file  . Years of education: Not on file  . Highest education level: Not on file  Occupational History  . Not on file  Tobacco Use  . Smoking status: Never Smoker  . Smokeless tobacco: Never Used  Substance and Sexual Activity  . Alcohol use: Not Currently  . Drug use: Not Currently  . Sexual activity: Yes  Other Topics Concern  . Not on file  Social History Narrative  . Not on file   Social Determinants of Health   Financial Resource Strain: Not on file  Food Insecurity: Not on file  Transportation Needs: Not on file  Physical Activity: Not on file  Stress: Not on file  Social Connections: Not on file  Intimate Partner Violence: Not on file     Current Outpatient Medications:  .  atenolol (TENORMIN) 25 MG tablet, Take 1 tablet (25 mg total) by mouth daily., Disp: 30 tablet, Rfl: 3 .  Biotin 1 MG CAPS, Take by mouth daily at 2 PM., Disp: , Rfl:  .  cholecalciferol (VITAMIN D3) 25 MCG (1000 UNIT) tablet, Take 1,000 Units by mouth daily., Disp: , Rfl:  .  metFORMIN (GLUCOPHAGE-XR) 500 MG 24 hr tablet, Take 2 tablets (1,000 mg total) by mouth daily., Disp: 60 tablet, Rfl: 5   No Known Allergies  ROS Review of Systems  Constitutional: Negative.   HENT: Negative.        Patient is goiter on both sides of the neck.  Eyes:  Negative.   Respiratory: Negative.   Cardiovascular: Negative.   Gastrointestinal: Negative.   Endocrine: Negative.   Genitourinary: Negative.   Musculoskeletal: Positive for arthralgias and myalgias.  Skin: Positive for rash.  Allergic/Immunologic: Negative.   Neurological: Negative.  Negative for speech difficulty.  Hematological: Negative.   Psychiatric/Behavioral: Negative.  Negative for agitation.  All other systems reviewed and are negative.     Objective:    Physical Exam Neck:     Thyroid: Thyromegaly present.      Comments: goiter    BP 128/84   Pulse (!) 102   Ht 5\' 4"  (1.626 m)   Wt 220 lb 6.4 oz (100 kg)   BMI 37.83 kg/m  Wt Readings from Last 3 Encounters:  07/12/20 220 lb 6.4 oz (100 kg)  06/27/20 224 lb (101.6 kg)  05/26/20 221 lb 6.4 oz (100.4 kg)     Health Maintenance Due  Topic Date Due  . Hepatitis C Screening  Never done  . COVID-19 Vaccine (1) Never done  . HIV Screening  Never done  . TETANUS/TDAP  Never done  . PAP-Cervical Cytology Screening  Never done  . PAP SMEAR-Modifier  Never done    There are no preventive care reminders to display for  this patient.  Lab Results  Component Value Date   TSH <0.01 (L) 06/30/2020   Lab Results  Component Value Date   WBC 7.9 06/30/2020   HGB 12.9 06/30/2020   HCT 39.1 06/30/2020   MCV 82.0 06/30/2020   PLT 384 06/30/2020   Lab Results  Component Value Date   NA 139 06/30/2020   K 3.9 06/30/2020   CO2 21 06/30/2020   GLUCOSE 94 06/30/2020   BUN 9 06/30/2020   CREATININE 0.33 (L) 06/30/2020   BILITOT 0.3 06/30/2020   ALKPHOS 97 03/06/2020   AST 25 06/30/2020   ALT 35 (H) 06/30/2020   PROT 6.6 06/30/2020   ALBUMIN 3.8 03/06/2020   CALCIUM 9.9 06/30/2020   ANIONGAP 15 03/06/2020   Lab Results  Component Value Date   CHOL 124 06/30/2020   Lab Results  Component Value Date   HDL 44 (L) 06/30/2020   Lab Results  Component Value Date   LDLCALC 54 06/30/2020   Lab Results   Component Value Date   TRIG 187 (H) 06/30/2020   Lab Results  Component Value Date   CHOLHDL 2.8 06/30/2020   Lab Results  Component Value Date   HGBA1C 4.7 05/26/2020      Assessment & Plan:   Problem List Items Addressed This Visit      Endocrine   Goiter diffuse, nontoxic    Patient has a goiter on the both sides of the neck.  TSH is low, will refer to endocrinologist        Other   Tachycardia - Primary    Patient was advised to continue using metoprolol for tachycardia      Relevant Orders   Holter monitor - 24 hour   Class 2 obesity without serious comorbidity with body mass index (BMI) of 38.0 to 38.9 in adult    - I encouraged the patient to lose weight.  - I educated them on making healthy dietary choices including eating more fruits and vegetables and less fried foods. - I encouraged the patient to exercise more, and educated on the benefits of exercise including weight loss, diabetes prevention, and hypertension prevention.   Dietary counseling with a registered dietician  Referral to a weight management support group (e.g. Weight Watchers, Overeaters Anonymous)  If your BMI is greater than 29 or you have gained more than 15 pounds you should work on weight loss.  Attend a healthy cooking class       Shortness of breath    It may be related to hyperthyroidism and obesity.       Holter monitor revealed supraventricular tachycardia mostly with a heart rate going up to 160  No orders of the defined types were placed in this encounter.   Follow-up: No follow-ups on file.  Ref to endocrine  [name], MD

## 2020-07-12 NOTE — Assessment & Plan Note (Signed)
Patient has a goiter on the both sides of the neck.  TSH is low, will refer to endocrinologist

## 2020-07-12 NOTE — Assessment & Plan Note (Signed)

## 2020-07-12 NOTE — Assessment & Plan Note (Signed)
It may be related to hyperthyroidism and obesity.

## 2020-07-12 NOTE — Assessment & Plan Note (Signed)
Patient was advised to continue using metoprolol for tachycardia

## 2020-07-14 NOTE — Addendum Note (Signed)
Addended by: Melody Comas L on: 07/14/2020 11:05 AM   Modules accepted: Orders

## 2020-07-21 ENCOUNTER — Other Ambulatory Visit: Payer: Self-pay | Admitting: Internal Medicine

## 2020-08-03 ENCOUNTER — Other Ambulatory Visit: Payer: Self-pay | Admitting: Internal Medicine

## 2020-09-22 ENCOUNTER — Other Ambulatory Visit: Payer: Self-pay

## 2020-09-22 ENCOUNTER — Encounter: Payer: Self-pay | Admitting: Endocrinology

## 2020-09-22 ENCOUNTER — Ambulatory Visit: Payer: BC Managed Care – PPO | Admitting: Endocrinology

## 2020-09-22 VITALS — BP 124/72 | HR 99 | Ht 64.0 in | Wt 213.0 lb

## 2020-09-22 DIAGNOSIS — E0590 Thyrotoxicosis, unspecified without thyrotoxic crisis or storm: Secondary | ICD-10-CM | POA: Diagnosis not present

## 2020-09-22 DIAGNOSIS — Z6838 Body mass index (BMI) 38.0-38.9, adult: Secondary | ICD-10-CM | POA: Diagnosis not present

## 2020-09-22 DIAGNOSIS — E6609 Other obesity due to excess calories: Secondary | ICD-10-CM

## 2020-09-22 LAB — POCT GLYCOSYLATED HEMOGLOBIN (HGB A1C): Hemoglobin A1C: 4.9 % (ref 4.0–5.6)

## 2020-09-22 MED ORDER — METHIMAZOLE 10 MG PO TABS: 20.0000 mg | ORAL_TABLET | Freq: Two times a day (BID) | ORAL | 1 refills | Status: DC

## 2020-09-22 NOTE — Patient Instructions (Addendum)
Please sign a release of information from [facility].  I have sent a prescription to your pharmacy, to slow the thyroid.   If ever you have fever while taking methimazole, stop it and call us, even if the reason is obvious, because of the risk of a rare side-effect. It is best to never miss the medication.  However, if you do miss it, next best is to double up the next time.   In the future, you can choose 1 of the other treatment options we discussed.   In view of your medical condition, you should avoid pregnancy until we have decided it is safe.   Please come back for a follow-up appointment in 1 month.

## 2020-09-22 NOTE — Progress Notes (Signed)
Subjective:    Patient ID: Shannon Newman, female    DOB: 1990/12/21, 30 y.o.   MRN: [id number]  HPI Pt is referred by Dr Juel Burrow, for hyperthyroidism.  Pt reports he was dx'ed with hyperthyroidism in 2017, when she lived in [place], [place].   she has never been on therapy for this.  she has never had XRT to the anterior neck, or thyroid surgery.  She had a bx in approx 2018.  He does not consume kelp or any other non-prescribed thyroid medication, or amiodarone.  She was rx'ed metoprolol for palpitations and tremor.  This helps somewhat.  She has lost 62 lbs x 3 years.   She takes metformin for prediabetes. Past Medical History:  Diagnosis Date   Shortness of breath     No past surgical history on file.  Social History   Socioeconomic History   Marital status: Single    Spouse name: Not on file   Number of children: Not on file   Years of education: Not on file   Highest education level: Not on file  Occupational History   Not on file  Tobacco Use   Smoking status: Never   Smokeless tobacco: Never  Substance and Sexual Activity   Alcohol use: Not Currently   Drug use: Not Currently   Sexual activity: Yes  Other Topics Concern   Not on file  Social History Narrative   Not on file   Social Determinants of Health   Financial Resource Strain: Not on file  Food Insecurity: Not on file  Transportation Needs: Not on file  Physical Activity: Not on file  Stress: Not on file  Social Connections: Not on file  Intimate Partner Violence: Not on file    Current Outpatient Medications on File Prior to Visit  Medication Sig Dispense Refill   cholecalciferol (VITAMIN D3) 25 MCG (1000 UNIT) tablet Take 1,000 Units by mouth daily.     metFORMIN (GLUCOPHAGE-XR) 500 MG 24 hr tablet Take 2 tablets (1,000 mg total) by mouth daily. 60 tablet 5   metoprolol succinate (TOPROL-XL) 50 MG 24 hr tablet Take 50 mg by mouth daily.     Biotin 1 MG CAPS Take by mouth daily at 2 PM.     No current  facility-administered medications on file prior to visit.    No Known Allergies  Family History  Problem Relation Age of Onset   Cancer Maternal Grandmother    Diabetes Maternal Grandmother    Heart disease Maternal Grandmother    Thyroid disease Maternal Grandmother     BP 124/72 (BP Location: Left Arm, Patient Position: Sitting, Cuff Size: Normal)   Pulse 99   Ht 5\' 4"  (1.626 m)   Wt 213 lb (96.6 kg)   SpO2 99%   BMI 36.56 kg/m    Review of Systems denies sob, excessive diaphoresis, and anxiety.  She has heat intolerance.      Objective:   Physical Exam VS: see vs page GEN: no distress HEAD: head: no deformity.  Moderate diffuse alopecia.  eyes: no periorbital swelling, no proptosis external nose and ears are normal.   NECK: thyroid is 5x normal size--diffuse.  CHEST WALL: no deformity LUNGS: clear to auscultation CV: reg rate and rhythm, no murmur.  MUSCULOSKELETAL: gait is normal and steady EXTEMITIES: no deformity.  no leg edema NEURO:  readily moves all 4's.  sensation is intact to touch on all 4's.  No tremor SKIN:  Normal texture and temperature.  No rash  or suspicious lesion is visible.  Not diaphoretic.   NODES:  None palpable at the neck PSYCH: alert, well-oriented.  Does not appear anxious nor depressed.  Lab Results  Component Value Date   TSH <0.01 (L) 06/30/2020   [place] (2015): 24x18x5 mm nodule LLP (LN vs PTH) (2016): nodule is smaller (2017): nodule is stable (2018): nodule is smaller (2020): diffuse goiter: no mention is made of the nodule  Cytol (2015): prob react LN  TSH (2020): 0.5     Assessment & Plan:  Hyperthyroidism, new to me.  We discussed rx options.  She chooses tapazole, at least for now.    Patient Instructions  Please sign a release of information from [facility].  I have sent a prescription to your pharmacy, to slow the thyroid.   If ever you have fever while taking methimazole, stop it and call us, even if  the reason is obvious, because of the risk of a rare side-effect. It is best to never miss the medication.  However, if you do miss it, next best is to double up the next time.   In the future, you can choose 1 of the other treatment options we discussed.   In view of your medical condition, you should avoid pregnancy until we have decided it is safe.   Please come back for a follow-up appointment in 1 month.

## 2020-10-13 ENCOUNTER — Ambulatory Visit: Payer: BC Managed Care – PPO | Admitting: Family Medicine

## 2020-10-19 ENCOUNTER — Encounter: Payer: Self-pay | Admitting: Internal Medicine

## 2020-10-19 ENCOUNTER — Ambulatory Visit: Payer: BC Managed Care – PPO | Admitting: Internal Medicine

## 2020-10-19 ENCOUNTER — Other Ambulatory Visit: Payer: Self-pay

## 2020-10-19 VITALS — BP 144/88 | HR 82 | Ht 64.0 in | Wt 219.6 lb

## 2020-10-19 DIAGNOSIS — E669 Obesity, unspecified: Secondary | ICD-10-CM | POA: Diagnosis not present

## 2020-10-19 DIAGNOSIS — R0602 Shortness of breath: Secondary | ICD-10-CM

## 2020-10-19 DIAGNOSIS — Z6838 Body mass index (BMI) 38.0-38.9, adult: Secondary | ICD-10-CM

## 2020-10-19 DIAGNOSIS — E040 Nontoxic diffuse goiter: Secondary | ICD-10-CM

## 2020-10-19 DIAGNOSIS — E8881 Metabolic syndrome: Secondary | ICD-10-CM | POA: Diagnosis not present

## 2020-10-19 DIAGNOSIS — E0500 Thyrotoxicosis with diffuse goiter without thyrotoxic crisis or storm: Secondary | ICD-10-CM

## 2020-10-19 DIAGNOSIS — R000 Tachycardia, unspecified: Secondary | ICD-10-CM | POA: Diagnosis not present

## 2020-10-19 NOTE — Assessment & Plan Note (Signed)
Improved a whole lot

## 2020-10-19 NOTE — Assessment & Plan Note (Signed)

## 2020-10-19 NOTE — Assessment & Plan Note (Signed)
Patient was seen by an endocrinologist who started her on methimazole, patient is doing well.  Is tired and fatigued.

## 2020-10-19 NOTE — Assessment & Plan Note (Signed)
Tachycardia is better.

## 2020-10-19 NOTE — Progress Notes (Signed)
Established Patient Office Visit  Subjective:  Patient ID: Shannon Newman, female    DOB: 1990/05/17  Age: 30 y.o. MRN: [id number]  CC:  Chief Complaint  Patient presents with   Follow-up    HPI  Shannon Newman presents for follow-up on the Graves' disease.  Past Medical History:  Diagnosis Date   Shortness of breath     History reviewed. No pertinent surgical history.  Family History  Problem Relation Age of Onset   Cancer Maternal Grandmother    Diabetes Maternal Grandmother    Heart disease Maternal Grandmother    Thyroid disease Maternal Grandmother     Social History   Socioeconomic History   Marital status: Single    Spouse name: Not on file   Number of children: Not on file   Years of education: Not on file   Highest education level: Not on file  Occupational History   Not on file  Tobacco Use   Smoking status: Never   Smokeless tobacco: Never  Substance and Sexual Activity   Alcohol use: Not Currently   Drug use: Not Currently   Sexual activity: Yes  Other Topics Concern   Not on file  Social History Narrative   Not on file   Social Determinants of Health   Financial Resource Strain: Not on file  Food Insecurity: Not on file  Transportation Needs: Not on file  Physical Activity: Not on file  Stress: Not on file  Social Connections: Not on file  Intimate Partner Violence: Not on file     Current Outpatient Medications:    cholecalciferol (VITAMIN D3) 25 MCG (1000 UNIT) tablet, Take 1,000 Units by mouth daily., Disp: , Rfl:    metFORMIN (GLUCOPHAGE-XR) 500 MG 24 hr tablet, Take 2 tablets (1,000 mg total) by mouth daily., Disp: 60 tablet, Rfl: 5   methimazole (TAPAZOLE) 10 MG tablet, Take 2 tablets (20 mg total) by mouth 2 (two) times daily., Disp: 360 tablet, Rfl: 1   metoprolol succinate (TOPROL-XL) 50 MG 24 hr tablet, Take 50 mg by mouth daily., Disp: , Rfl:    No Known Allergies  ROS Review of Systems  Constitutional: Negative.    HENT: Negative.    Eyes: Negative.   Respiratory: Negative.    Cardiovascular: Negative.   Gastrointestinal: Negative.   Endocrine: Negative.   Genitourinary: Negative.   Musculoskeletal: Negative.   Skin: Negative.   Allergic/Immunologic: Negative.   Neurological: Negative.   Hematological: Negative.   Psychiatric/Behavioral: Negative.    All other systems reviewed and are negative.    Objective:    Physical Exam Vitals reviewed.  Constitutional:      Appearance: Normal appearance.  HENT:     Mouth/Throat:     Mouth: Mucous membranes are moist.  Eyes:     Pupils: Pupils are equal, round, and reactive to light.  Neck:     Vascular: No carotid bruit.  Cardiovascular:     Rate and Rhythm: Normal rate and regular rhythm.     Pulses: Normal pulses.     Heart sounds: Normal heart sounds.  Pulmonary:     Effort: Pulmonary effort is normal.     Breath sounds: Normal breath sounds.  Abdominal:     General: Bowel sounds are normal.     Palpations: Abdomen is soft. There is no hepatomegaly, splenomegaly or mass.     Tenderness: There is no abdominal tenderness.     Hernia: No hernia is present.  Musculoskeletal:  General: No tenderness.     Cervical back: Neck supple.     Right lower leg: No edema.     Left lower leg: No edema.  Skin:    Findings: No rash.  Neurological:     Mental Status: She is alert and oriented to person, place, and time.     Motor: No weakness.  Psychiatric:        Mood and Affect: Mood and affect normal.        Behavior: Behavior normal.    BP (!) 144/88   Pulse 82   Ht 5\' 4"  (1.626 m)   Wt 219 lb 9.6 oz (99.6 kg)   BMI 37.69 kg/m  Wt Readings from Last 3 Encounters:  10/19/20 219 lb 9.6 oz (99.6 kg)  09/22/20 213 lb (96.6 kg)  07/12/20 220 lb 6.4 oz (100 kg)     Health Maintenance Due  Topic Date Due   COVID-19 Vaccine (1) Never done   HIV Screening  Never done   Hepatitis C Screening  Never done   TETANUS/TDAP  Never  done   PAP-Cervical Cytology Screening  Never done   PAP SMEAR-Modifier  Never done   INFLUENZA VACCINE  10/10/2020    There are no preventive care reminders to display for this patient.  Lab Results  Component Value Date   TSH <0.01 (L) 06/30/2020   Lab Results  Component Value Date   WBC 7.9 06/30/2020   HGB 12.9 06/30/2020   HCT 39.1 06/30/2020   MCV 82.0 06/30/2020   PLT 384 06/30/2020   Lab Results  Component Value Date   NA 139 06/30/2020   K 3.9 06/30/2020   CO2 21 06/30/2020   GLUCOSE 94 06/30/2020   BUN 9 06/30/2020   CREATININE 0.33 (L) 06/30/2020   BILITOT 0.3 06/30/2020   ALKPHOS 97 03/06/2020   AST 25 06/30/2020   ALT 35 (H) 06/30/2020   PROT 6.6 06/30/2020   ALBUMIN 3.8 03/06/2020   CALCIUM 9.9 06/30/2020   ANIONGAP 15 03/06/2020   Lab Results  Component Value Date   CHOL 124 06/30/2020   Lab Results  Component Value Date   HDL 44 (L) 06/30/2020   Lab Results  Component Value Date   LDLCALC 54 06/30/2020   Lab Results  Component Value Date   TRIG 187 (H) 06/30/2020   Lab Results  Component Value Date   CHOLHDL 2.8 06/30/2020   Lab Results  Component Value Date   HGBA1C 4.9 09/22/2020      Assessment & Plan:   Problem List Items Addressed This Visit       Endocrine   Insulin resistance - Primary   Goiter diffuse, nontoxic   Graves disease    Patient was seen by an endocrinologist who started her on methimazole, patient is doing well.  Is tired and fatigued.         Other   Tachycardia    Tachycardia is better.       Class 2 obesity without serious comorbidity with body mass index (BMI) of 38.0 to 38.9 in adult    - I encouraged the patient to lose weight.  - I educated them on making healthy dietary choices including eating more fruits and vegetables and less fried foods. - I encouraged the patient to exercise more, and educated on the benefits of exercise including weight loss, diabetes prevention, and hypertension  prevention.   Dietary counseling with a registered dietician  Referral to a weight management  support group (e.g. Weight Watchers, Overeaters Anonymous)  If your BMI is greater than 29 or you have gained more than 15 pounds you should work on weight loss.  Attend a healthy cooking class        Shortness of breath    Improved a whole lot        No orders of the defined types were placed in this encounter.   Follow-up: No follow-ups on file.    [name], MD

## 2020-10-27 ENCOUNTER — Other Ambulatory Visit: Payer: Self-pay | Admitting: *Deleted

## 2020-10-27 MED ORDER — METOPROLOL SUCCINATE ER 50 MG PO TB24: 50.0000 mg | ORAL_TABLET | Freq: Every day | ORAL | 3 refills | Status: DC

## 2020-10-27 MED ORDER — METFORMIN HCL ER 500 MG PO TB24
1000.0000 mg | ORAL_TABLET | Freq: Every day | ORAL | 3 refills | Status: DC
Start: 2020-10-27 — End: 2021-12-05

## 2021-01-24 ENCOUNTER — Encounter: Payer: BC Managed Care – PPO | Admitting: Internal Medicine

## 2021-09-18 DIAGNOSIS — J020 Streptococcal pharyngitis: Secondary | ICD-10-CM | POA: Diagnosis not present

## 2021-09-18 DIAGNOSIS — J029 Acute pharyngitis, unspecified: Secondary | ICD-10-CM | POA: Diagnosis not present

## 2021-12-05 ENCOUNTER — Ambulatory Visit: Payer: BC Managed Care – PPO | Admitting: Internal Medicine

## 2021-12-05 ENCOUNTER — Encounter: Payer: Self-pay | Admitting: Internal Medicine

## 2021-12-05 VITALS — Ht 64.0 in | Wt 219.3 lb

## 2021-12-05 DIAGNOSIS — R000 Tachycardia, unspecified: Secondary | ICD-10-CM

## 2021-12-05 DIAGNOSIS — Z6838 Body mass index (BMI) 38.0-38.9, adult: Secondary | ICD-10-CM

## 2021-12-05 DIAGNOSIS — E040 Nontoxic diffuse goiter: Secondary | ICD-10-CM | POA: Diagnosis not present

## 2021-12-05 DIAGNOSIS — E8881 Metabolic syndrome: Secondary | ICD-10-CM

## 2021-12-05 DIAGNOSIS — E0500 Thyrotoxicosis with diffuse goiter without thyrotoxic crisis or storm: Secondary | ICD-10-CM

## 2021-12-05 DIAGNOSIS — E669 Obesity, unspecified: Secondary | ICD-10-CM

## 2021-12-05 DIAGNOSIS — Z124 Encounter for screening for malignant neoplasm of cervix: Secondary | ICD-10-CM

## 2021-12-05 MED ORDER — ATENOLOL 25 MG PO TABS: 25.0000 mg | ORAL_TABLET | Freq: Every day | ORAL | 3 refills | Status: DC

## 2021-12-05 MED ORDER — METFORMIN HCL ER 500 MG PO TB24: 1000.0000 mg | ORAL_TABLET | Freq: Every day | ORAL | 3 refills | Status: DC

## 2021-12-05 MED ORDER — VITAMIN D 25 MCG (1000 UNIT) PO TABS: 1000.0000 [IU] | ORAL_TABLET | Freq: Every day | ORAL | 3 refills | Status: DC

## 2021-12-05 NOTE — Assessment & Plan Note (Signed)
Refer to endocrine

## 2021-12-05 NOTE — Progress Notes (Signed)
New Patient Office Visit  Subjective:  Patient ID: Shannon Newman, female    DOB: 03/10/1991  Age: 31 y.o. MRN: [id number]  CC:  Chief Complaint  Patient presents with   Follow-up    HPI Patient presents for check up  Past Medical History:  Diagnosis Date   Shortness of breath      Current Outpatient Medications:    atenolol (TENORMIN) 25 MG tablet, Take 1 tablet (25 mg total) by mouth daily., Disp: 90 tablet, Rfl: 3   cholecalciferol (VITAMIN D3) 25 MCG (1000 UNIT) tablet, Take 1 tablet (1,000 Units total) by mouth daily., Disp: 90 tablet, Rfl: 3   metFORMIN (GLUCOPHAGE-XR) 500 MG 24 hr tablet, Take 2 tablets (1,000 mg total) by mouth daily., Disp: 180 tablet, Rfl: 3   History reviewed. No pertinent surgical history.  Family History  Problem Relation Age of Onset   Cancer Maternal Grandmother    Diabetes Maternal Grandmother    Heart disease Maternal Grandmother    Thyroid disease Maternal Grandmother     Social History   Socioeconomic History   Marital status: Single    Spouse name: Not on file   Number of children: Not on file   Years of education: Not on file   Highest education level: Not on file  Occupational History   Not on file  Tobacco Use   Smoking status: Never   Smokeless tobacco: Never  Substance and Sexual Activity   Alcohol use: Not Currently   Drug use: Not Currently   Sexual activity: Yes  Other Topics Concern   Not on file  Social History Narrative   Not on file   Social Determinants of Health   Financial Resource Strain: Not on file  Food Insecurity: Not on file  Transportation Needs: Not on file  Physical Activity: Not on file  Stress: Not on file  Social Connections: Not on file  Intimate Partner Violence: Not on file    ROS Review of Systems  Constitutional: Negative.   HENT: Negative.    Eyes: Negative.   Respiratory: Negative.    Cardiovascular: Negative.   Gastrointestinal: Negative.   Endocrine: Negative.    Genitourinary: Negative.   Musculoskeletal: Negative.   Skin: Negative.   Allergic/Immunologic: Negative.   Neurological: Negative.   Hematological: Negative.   Psychiatric/Behavioral: Negative.    All other systems reviewed and are negative.   Objective:   Today's Vitals: Ht 5\' 4"  (1.626 m)   Wt 219 lb 4.8 oz (99.5 kg)   BMI 37.64 kg/m   Physical Exam Constitutional:      Appearance: Normal appearance. She is obese.  HENT:     Head: Normocephalic.     Nose: Nose normal.     Mouth/Throat:     Mouth: Mucous membranes are moist.  Eyes:     Pupils: Pupils are equal, round, and reactive to light.  Neck:      Comments: goiter Cardiovascular:     Rate and Rhythm: Normal rate.  Pulmonary:     Effort: Pulmonary effort is normal.  Abdominal:     Palpations: Abdomen is soft.  Musculoskeletal:        General: Normal range of motion.     Cervical back: Normal range of motion.  Skin:    General: Skin is warm.  Neurological:     General: No focal deficit present.     Mental Status: She is alert.  Psychiatric:        Mood and Affect:  Mood normal.     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 daily she does not like the metoprolol      Class 2 obesity without serious comorbidity with body mass index (BMI) of 38.0 to 38.9 in adult    - I encouraged the patient to lose weight.  - I educated them on making healthy dietary choices including eating more fruits and vegetables and less fried foods. - I encouraged the patient to exercise  more, and educated on the benefits of exercise including weight loss, diabetes prevention, and hypertension prevention.   Dietary counseling with a registered dietician  Referral to a weight management support group (e.g. Weight Watchers, Overeaters Anonymous)  If your BMI is greater than 29 or you have gained more than 15 pounds you should work on weight loss.  Attend a healthy cooking class       Relevant Medications   metFORMIN (GLUCOPHAGE-XR) 500 MG 24 hr tablet   Other Visit Diagnoses     Cervical cancer screening    -  Primary   Relevant Orders   Ambulatory referral to Obstetrics / Gynecology       Outpatient Encounter Medications as of 12/05/2021  Medication Sig   atenolol (TENORMIN) 25 MG tablet Take 1 tablet (25 mg total) by mouth daily.   [DISCONTINUED] cholecalciferol (VITAMIN D3) 25 MCG (1000 UNIT) tablet Take 1,000 Units by mouth daily.   [DISCONTINUED] metFORMIN (GLUCOPHAGE-XR) 500 MG 24 hr tablet Take 2 tablets (1,000 mg total) by mouth daily.   [DISCONTINUED] methimazole (TAPAZOLE) 10 MG tablet Take 2 tablets (20 mg total) by mouth 2 (two) times daily.   [DISCONTINUED] metoprolol succinate (TOPROL-XL) 50 MG 24 hr tablet Take 1 tablet (50 mg total) by mouth daily.   cholecalciferol (VITAMIN D3) 25 MCG (1000 UNIT) tablet Take 1 tablet (1,000 Units total) by mouth daily.   metFORMIN (GLUCOPHAGE-XR) 500 MG 24 hr tablet Take 2 tablets (1,000 mg total) by mouth daily.   No facility-administered encounter medications on file as of 12/05/2021.    Follow-up: No follow-ups on file.   [name], MD

## 2021-12-05 NOTE — Assessment & Plan Note (Signed)
Patient has had bilateral  goiter, we will refer her to endocrine

## 2021-12-05 NOTE — Assessment & Plan Note (Signed)

## 2021-12-05 NOTE — Assessment & Plan Note (Signed)
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

## 2021-12-06 NOTE — Addendum Note (Signed)
Addended by: Alois Cliche on: 12/06/2021 09:56 AM   Modules accepted: Orders

## 2021-12-07 ENCOUNTER — Ambulatory Visit (INDEPENDENT_AMBULATORY_CARE_PROVIDER_SITE_OTHER): Payer: BC Managed Care – PPO | Admitting: *Deleted

## 2021-12-07 DIAGNOSIS — E8881 Metabolic syndrome: Secondary | ICD-10-CM

## 2021-12-07 DIAGNOSIS — E040 Nontoxic diffuse goiter: Secondary | ICD-10-CM

## 2021-12-07 DIAGNOSIS — E669 Obesity, unspecified: Secondary | ICD-10-CM

## 2021-12-08 LAB — COMPLETE METABOLIC PANEL WITH GFR
AG Ratio: 1.3 (calc) (ref 1.0–2.5)
ALT: 19 U/L (ref 6–29)
AST: 21 U/L (ref 10–30)
Albumin: 4.1 g/dL (ref 3.6–5.1)
Alkaline phosphatase (APISO): 147 U/L — ABNORMAL HIGH (ref 31–125)
BUN/Creatinine Ratio: 22 (calc) (ref 6–22)
BUN: 8 mg/dL (ref 7–25)
CO2: 23 mmol/L (ref 20–32)
Calcium: 10 mg/dL (ref 8.6–10.2)
Chloride: 103 mmol/L (ref 98–110)
Creat: 0.36 mg/dL — ABNORMAL LOW (ref 0.50–0.96)
Globulin: 3.1 g/dL (calc) (ref 1.9–3.7)
Glucose, Bld: 90 mg/dL (ref 65–99)
Potassium: 4.2 mmol/L (ref 3.5–5.3)
Sodium: 140 mmol/L (ref 135–146)
Total Bilirubin: 0.4 mg/dL (ref 0.2–1.2)
Total Protein: 7.2 g/dL (ref 6.1–8.1)
eGFR: 142 mL/min/{1.73_m2} (ref >=60)

## 2021-12-08 LAB — CBC WITH DIFFERENTIAL/PLATELET
Absolute Monocytes: 498 cells/uL (ref 200–950)
Basophils Absolute: 18 cells/uL (ref 0–200)
Basophils Relative: 0.3 %
Eosinophils Absolute: 150 cells/uL (ref 15–500)
Eosinophils Relative: 2.5 %
HCT: 40.6 % (ref 35.0–45.0)
Hemoglobin: 13.5 g/dL (ref 11.7–15.5)
Lymphs Abs: 2292 cells/uL (ref 850–3900)
MCH: 26.9 pg — ABNORMAL LOW (ref 27.0–33.0)
MCHC: 33.3 g/dL (ref 32.0–36.0)
MCV: 80.9 fL (ref 80.0–100.0)
MPV: 10.4 fL (ref 7.5–12.5)
Monocytes Relative: 8.3 %
Neutro Abs: 3042 cells/uL (ref 1500–7800)
Neutrophils Relative %: 50.7 %
Platelets: 360 10*3/uL (ref 140–400)
RBC: 5.02 10*6/uL (ref 3.80–5.10)
RDW: 13.1 % (ref 11.0–15.0)
Total Lymphocyte: 38.2 %
WBC: 6 10*3/uL (ref 3.8–10.8)

## 2021-12-08 LAB — TSH: TSH: <0.01 mIU/L — ABNORMAL LOW

## 2021-12-08 LAB — LIPID PANEL
Cholesterol: 134 mg/dL (ref <200)
HDL: 50 mg/dL (ref >=50)
LDL Cholesterol (Calc): 63 mg/dL (calc)
Non-HDL Cholesterol (Calc): 84 mg/dL (calc) (ref <130)
Total CHOL/HDL Ratio: 2.7 (calc) (ref <5.0)
Triglycerides: 133 mg/dL (ref <150)

## 2021-12-08 LAB — HEMOGLOBIN A1C
Hgb A1c MFr Bld: 4.7 % of total Hgb (ref <5.7)
Mean Plasma Glucose: 88 mg/dL
eAG (mmol/L): 4.9 mmol/L

## 2021-12-08 LAB — T3: T3, Total: 525 ng/dL — ABNORMAL HIGH (ref 76–181)

## 2021-12-08 LAB — T4: T4, Total: 23.4 ug/dL — ABNORMAL HIGH (ref 5.1–11.9)

## 2021-12-21 ENCOUNTER — Encounter: Payer: Self-pay | Admitting: Obstetrics and Gynecology

## 2022-07-30 IMAGING — CT CT NECK W/ CM
4 series · 14 of 33 positions shown, 17 images · IV contrast (omnipaque)
Comparison: None.

CLINICAL DATA: Sore throat for 2 days.

EXAM:
CT NECK WITH CONTRAST
TECHNIQUE: Multidetector CT imaging of the neck was performed using the
standard protocol following the bolus administration of intravenous
contrast.
CONTRAST:  75mL OMNIPAQUE IOHEXOL 300 MG/ML  SOLN

[Series 2: axial neck · axial · 0.54mm/px · z∈[-295,-141]mm · 5 of 117 slices shown, 7 images]
[im 20/117  soft-tissue]
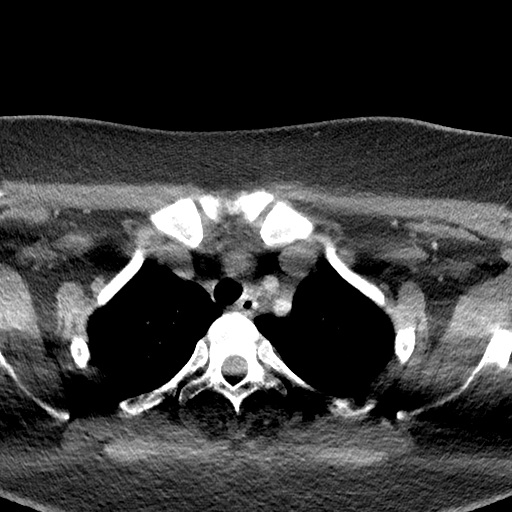
[im 20/117  bone]
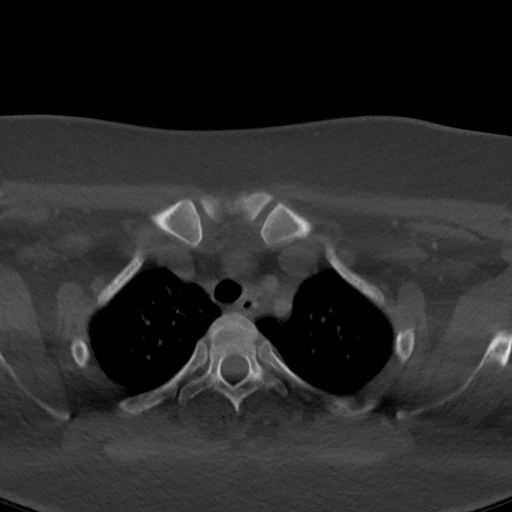
[im 39/117  bone]
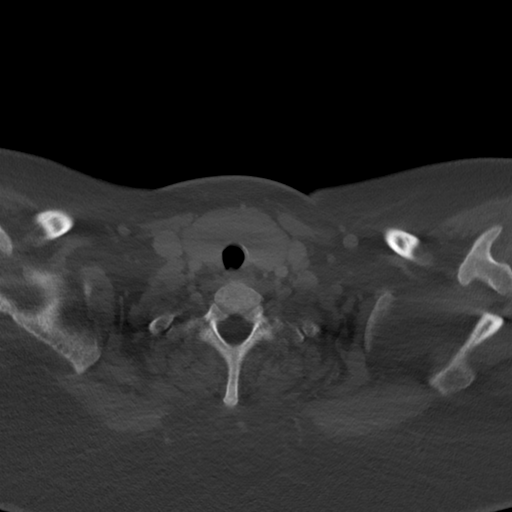
[im 59/117  bone]
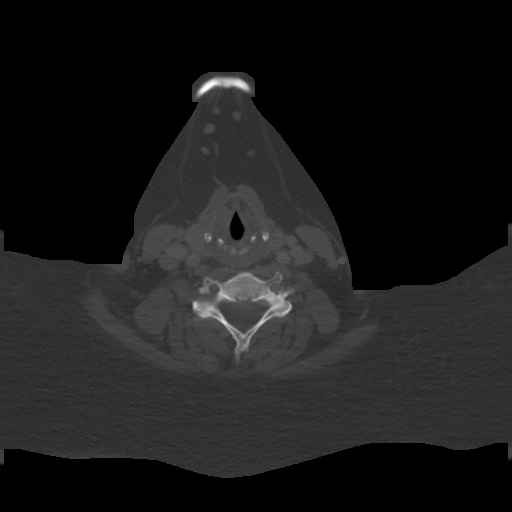
[im 78/117  bone]
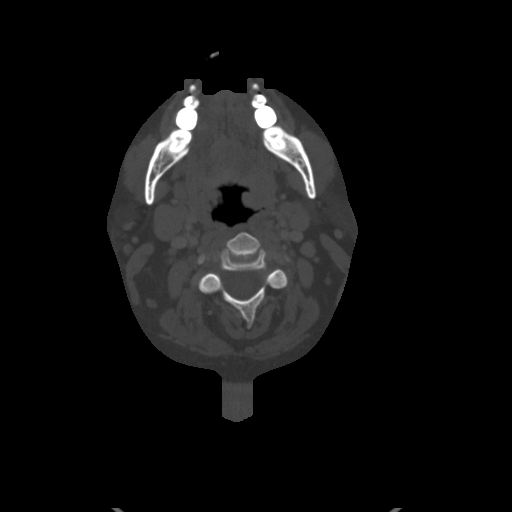
[im 97/117  soft-tissue]
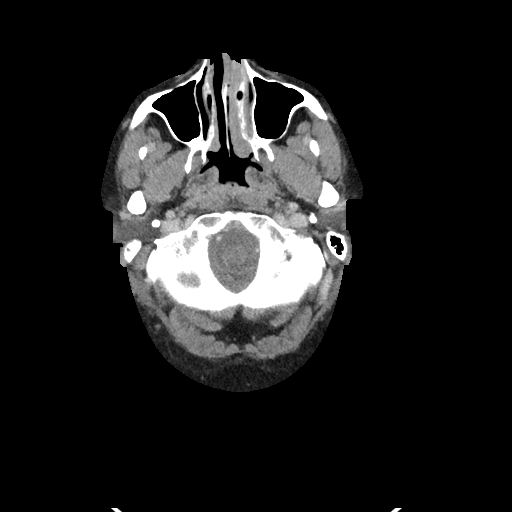
[im 97/117  bone]
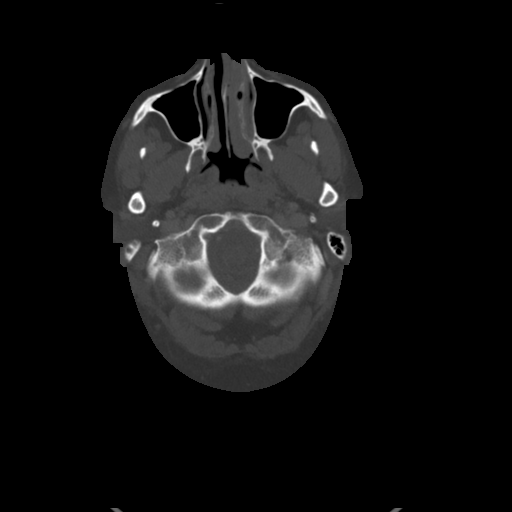

[Series 5: sag neck · sagittal · 0.49mm/px · 5 of 108 slices shown, 6 images]
[im 36/108  bone]
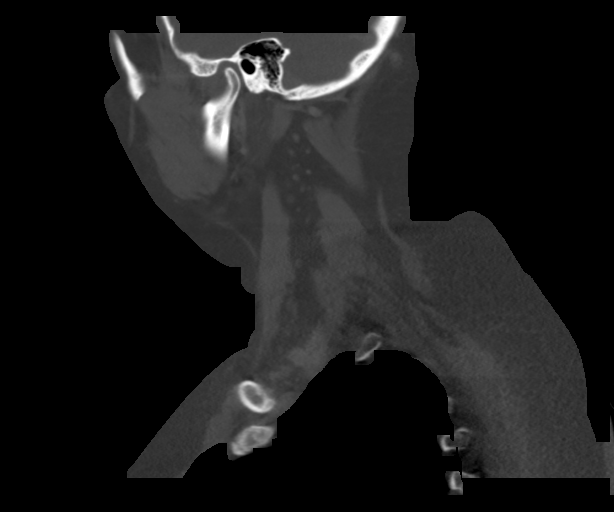
[im 45/108  bone]
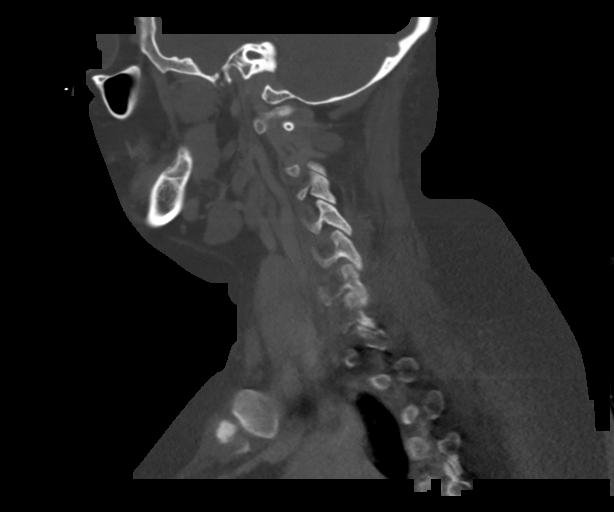
[im 54/108  soft-tissue]
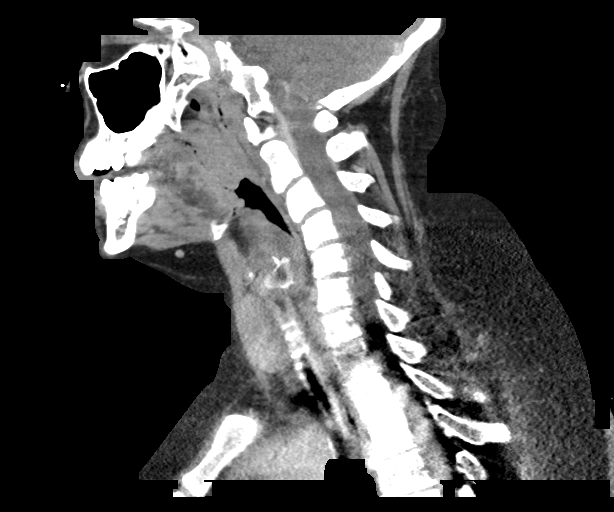
[im 54/108  bone]
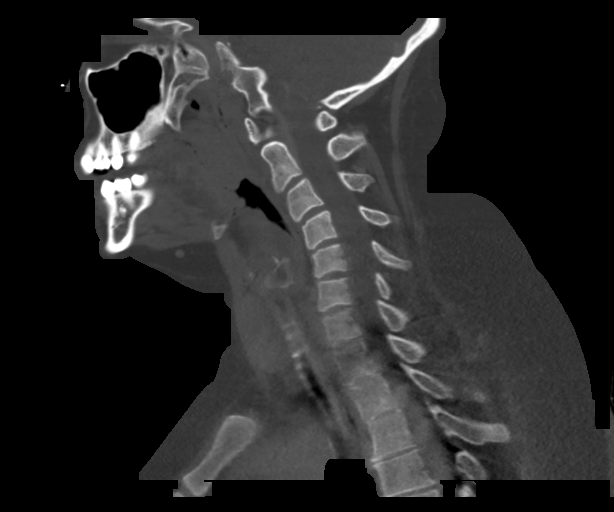
[im 63/108  bone]
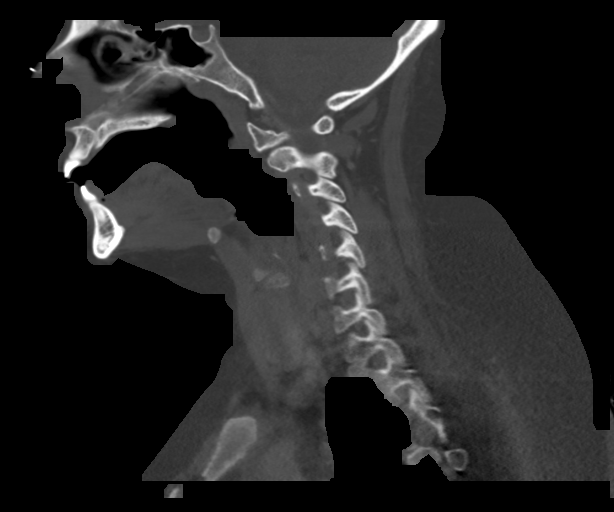
[im 72/108  bone]
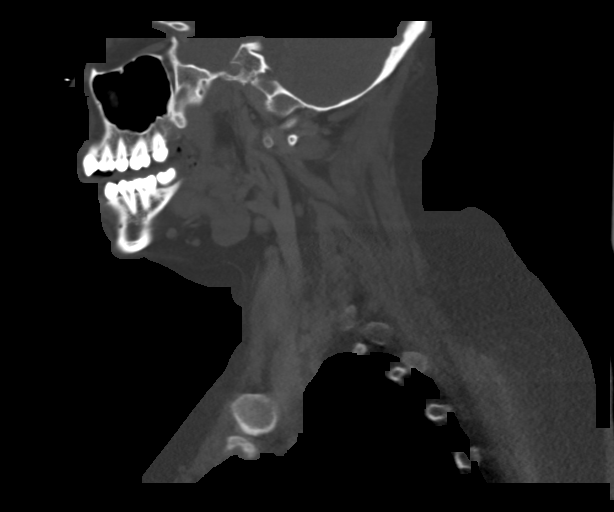

[Series 6: cor neck · coronal · 0.47mm/px · 3 of 151 slices shown]
[im 31/151  bone]
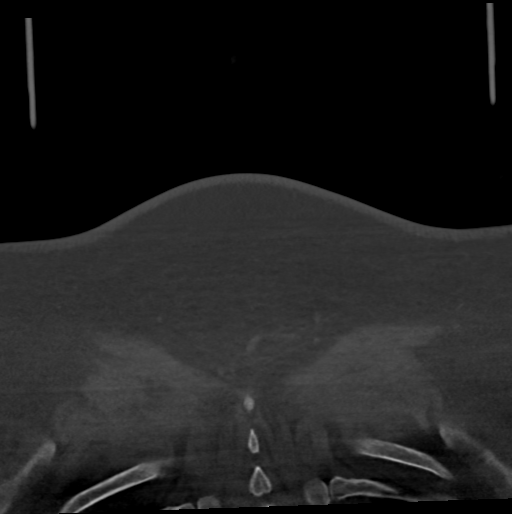
[im 61/151  bone]
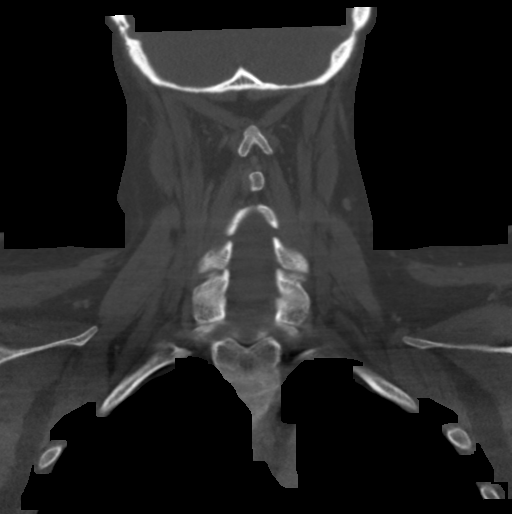
[im 91/151  bone]
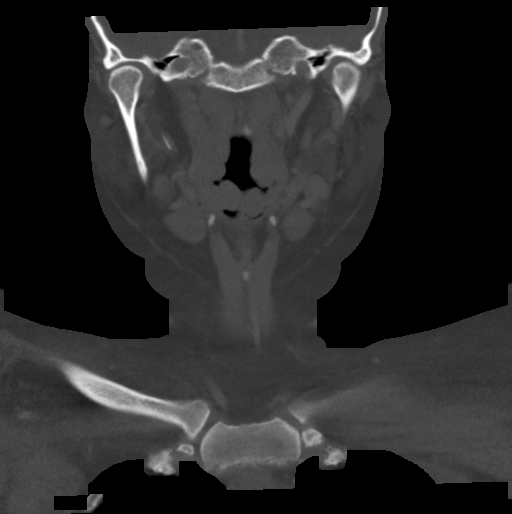

[Series 7: orthogonal ax · axial · 0.44mm/px · 1 of 122 slices shown]
[im 21/122  bone]
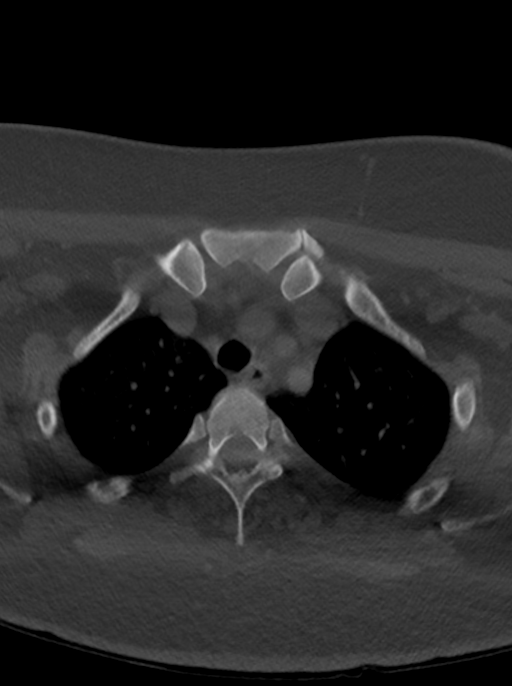

[14 of 33 positions shown; findings below may reference images not displayed]

FINDINGS: Pharynx and larynx: Mild bilateral enlargement/hyperplasia of the
palatine and lingual tonsils. Mild symmetric submucosal edema in the
hypopharynx and supraglottic larynx. Patent airway. No fluid
collection or inflammatory changes in the parapharyngeal or
retropharyngeal spaces.

Salivary glands: No inflammation, mass, or stone.

Thyroid: Moderate diffuse homogeneous enlargement of the thyroid
gland for which no follow-up imaging is recommended.

Lymph nodes: Enlarged level II a lymph nodes measuring up to 1.8 cm
in short axis bilaterally. Borderline enlarged level IB lymph nodes
measuring up to 1.0 cm bilaterally.

Vascular: Major vascular structures of the neck are grossly patent.

Limited intracranial: Unremarkable.

Visualized orbits: Unremarkable.

Mastoids and visualized paranasal sinuses: Minimal right ethmoid air
cell mucosal thickening. Clear mastoid air cells.

Skeleton: No acute osseous abnormality or suspicious osseous lesion.

Upper chest: Clear lung apices.

Other: None.
IMPRESSION: 1. Mild submucosal edema in the hypopharynx and supraglottic larynx
which may reflect mild pharyngitis/laryngitis. No abscess.
2. Mild bilateral cervical lymphadenopathy, likely reactive.

## 2022-12-11 ENCOUNTER — Encounter: Payer: Self-pay | Admitting: Nurse Practitioner

## 2022-12-11 ENCOUNTER — Ambulatory Visit: Payer: 59 | Admitting: Nurse Practitioner

## 2022-12-11 VITALS — BP 112/70 | HR 84 | Temp 98.3°F | Ht 64.0 in | Wt 212.6 lb

## 2022-12-11 DIAGNOSIS — R000 Tachycardia, unspecified: Secondary | ICD-10-CM | POA: Diagnosis not present

## 2022-12-11 DIAGNOSIS — Z13220 Encounter for screening for lipoid disorders: Secondary | ICD-10-CM

## 2022-12-11 DIAGNOSIS — E669 Obesity, unspecified: Secondary | ICD-10-CM | POA: Diagnosis not present

## 2022-12-11 DIAGNOSIS — E040 Nontoxic diffuse goiter: Secondary | ICD-10-CM | POA: Diagnosis not present

## 2022-12-11 DIAGNOSIS — E559 Vitamin D deficiency, unspecified: Secondary | ICD-10-CM | POA: Insufficient documentation

## 2022-12-11 DIAGNOSIS — E0590 Thyrotoxicosis, unspecified without thyrotoxic crisis or storm: Secondary | ICD-10-CM | POA: Insufficient documentation

## 2022-12-11 MED ORDER — ATENOLOL 25 MG PO TABS: 25.0000 mg | ORAL_TABLET | Freq: Every day | ORAL | 3 refills | Status: DC

## 2022-12-11 NOTE — Assessment & Plan Note (Signed)
Will check A1c today. Will discontinue Metformin XR, due to side effects and stable labs. She is going to work on [employer] and exercise. Advised increasing protein and decreasing carbs. Discussed importance of diet, exercise and lifestyle modifications even with medications. Will continue to monitor.

## 2022-12-11 NOTE — Assessment & Plan Note (Addendum)
Lab work as outlined. Will refer back to Endocrinology and get new thyroid US. Discussed with patient that this could be contributing to many of her other symptoms, such as tachycardia and difficulty with her weight. Counseled on importance of follow up with Endocrinology. Will continue to monitor.

## 2022-12-11 NOTE — Progress Notes (Signed)
Shannon Dicker, NP-C Phone: [phone]  Sheetal Lyall is a 32 y.o. female who presents today to establish care.   Patient with history of hyperthyroidism. She is not currently on any medications. She was evaluated by Endocrinology once in 2022. She is symptomatic. She has heat intolerance, weight changes, hair thinning/loss. She does have an enlarged thyroid. She has not had imaging since December 2021. She is open to seeing Endocrinology again. She also has a history of insulin resistance and has been on Metformin for several years. She does have diarrhea and abdominal pain with the medication. Her last A1c was 4.7 in September 2023. She is interested in medication to help her lose weight. She was on Phentermine in the past and did not have any weight loss. She does not exercise. She mostly eats out, she only eats 1-2 meals per day.   Active Ambulatory Problems    Diagnosis Date Noted   Insulin resistance 05/26/2020   Encounter to establish care 05/26/2020   Tachycardia 05/26/2020   Goiter diffuse, nontoxic 06/27/2020   Graves disease 10/19/2020   Hyperthyroidism 12/11/2022   Vitamin D deficiency 12/11/2022   Obesity (BMI 30-39.9) 12/11/2022   Resolved Ambulatory Problems    Diagnosis Date Noted   Class 2 obesity without serious comorbidity with body mass index (BMI) of 38.0 to 38.9 in adult 06/27/2020   Shortness of breath    Past Medical History:  Diagnosis Date   Anxiety 2019   GERD (gastroesophageal reflux disease) 2022   Thyroid disease 2016-2017    Family History  Problem Relation Age of Onset   Cancer Maternal Grandmother    Diabetes Maternal Grandmother    Heart disease Maternal Grandmother    Thyroid disease Maternal Grandmother    Anxiety disorder Mother    Depression Mother     Social History   Socioeconomic History   Marital status: Single    Spouse name: Not on file   Number of children: Not on file   Years of education: Not on file   Highest education  level: Associate degree: academic program  Occupational History   Not on file  Tobacco Use   Smoking status: Never   Smokeless tobacco: Never   Tobacco comments:    N/A  Substance and Sexual Activity   Alcohol use: Not Currently    Comment: Only drink when going out to a restaurant w/ family/friends,   Drug use: Never   Sexual activity: Yes    Birth control/protection: None  Other Topics Concern   Not on file  Social History Narrative   Not on file   Social Determinants of Health   Financial Resource Strain: Low Risk  (12/07/2022)   Overall Financial Resource Strain (CARDIA)    Difficulty of Paying Living Expenses: Not very hard  Food Insecurity: Food Insecurity Present (12/07/2022)   Hunger Vital Sign    Worried About Running Out of Food in the Last Year: Sometimes true    Ran Out of Food in the Last Year: Never true  Transportation Needs: No Transportation Needs (12/07/2022)   PRAPARE - Administrator, Civil Service (Medical): No    Lack of Transportation (Non-Medical): No  Physical Activity: Unknown (12/07/2022)   Exercise Vital Sign    Days of Exercise per Week: 0 days    Minutes of Exercise per Session: Not on file  Stress: Stress Concern Present (12/07/2022)   [employer] of Occupational Health - Occupational Stress Questionnaire    Feeling of Stress :  Rather much  Social Connections: Socially Isolated (12/07/2022)   Social Connection and Isolation Panel [NHANES]    Frequency of Communication with Friends and Family: Once a week    Frequency of Social Gatherings with Friends and Family: Never    Attends Religious Services: Never    Diplomatic Services operational officer: No    Attends Engineer, structural: Not on file    Marital Status: Never married  Catering manager Violence: Not on file    ROS  General:  Negative for unexplained weight loss, fever Skin: Negative for new or changing mole, sore that won't heal HEENT: Negative for  trouble hearing, trouble seeing, ringing in ears, mouth sores, hoarseness, change in voice, dysphagia. CV:  Negative for chest pain, dyspnea, edema, palpitations Resp: Negative for cough, dyspnea, hemoptysis GI: Negative for nausea, vomiting, diarrhea, constipation, abdominal pain, melena, hematochezia. GU: Negative for dysuria, incontinence, urinary hesitance, hematuria, vaginal or penile discharge, polyuria, sexual difficulty, lumps in testicle or breasts MSK: Negative for muscle cramps or aches, joint pain or swelling Neuro: Negative for headaches, weakness, numbness, dizziness, passing out/fainting Psych: Negative for depression, anxiety, memory problems  Objective  Physical Exam Vitals:   12/11/22 1517  BP: 112/70  Pulse: 84  Temp: 98.3 F (36.8 C)  SpO2: 99%    BP Readings from Last 3 Encounters:  12/11/22 112/70  10/19/20 (!) 144/88  09/22/20 124/72   Wt Readings from Last 3 Encounters:  12/11/22 212 lb 9.6 oz (96.4 kg)  12/05/21 219 lb 4.8 oz (99.5 kg)  10/19/20 219 lb 9.6 oz (99.6 kg)    Physical Exam Constitutional:      General: She is not in acute distress.    Appearance: Normal appearance.  HENT:     Head: Normocephalic.  Neck:     Thyroid: Thyromegaly present.  Cardiovascular:     Rate and Rhythm: Normal rate and regular rhythm.     Heart sounds: Normal heart sounds.  Pulmonary:     Effort: Pulmonary effort is normal.     Breath sounds: Normal breath sounds.  Skin:    General: Skin is warm and dry.  Neurological:     General: No focal deficit present.     Mental Status: She is alert.  Psychiatric:        Mood and Affect: Mood normal.        Behavior: Behavior normal.    Assessment/Plan:   Hyperthyroidism Assessment & Plan: Lab work as outlined. Will refer back to Endocrinology and get new thyroid US. Discussed with patient that this could be contributing to many of her other symptoms, such as tachycardia and difficulty with her weight.  Counseled on importance of follow up with Endocrinology. Will continue to monitor.   Orders: -     TSH+T4F+T3Free+ThyAbs+TPO+VD25 -     US THYROID; Future -     Ambulatory referral to Endocrinology  Goiter diffuse, nontoxic -     US THYROID; Future -     Ambulatory referral to Endocrinology  Tachycardia Assessment & Plan: Chronic. Stable on Atenolol 25 mg daily. Continue. Refills sent. Not tachy today in office. Lab work as outlined. Will continue to monitor.   Orders: -     Atenolol; Take 1 tablet (25 mg total) by mouth daily.  Dispense: 90 tablet; Refill: 3 -     CBC with Differential/Platelet -     Comprehensive metabolic panel  Obesity (BMI 30-39.9) Assessment & Plan: Will check A1c today. Will discontinue  Metformin XR, due to side effects and stable labs. She is going to work on [employer] and exercise. Advised increasing protein and decreasing carbs. Discussed importance of diet, exercise and lifestyle modifications even with medications. Will continue to monitor.   Orders: -     Hemoglobin A1c  Vitamin D deficiency Assessment & Plan: Chronic. Taking OTC daily supplement 5,000 units daily. Will check vitamin D level today.    Lipid screening -     Lipid panel   Return in about 3 months (around 03/13/2023) for Follow up.   Shannon Dicker, NP-C [facility] [employer]

## 2022-12-11 NOTE — Assessment & Plan Note (Signed)
Chronic. Stable on Atenolol 25 mg daily. Continue. Refills sent. Not tachy today in office. Lab work as outlined. Will continue to monitor.

## 2022-12-11 NOTE — Assessment & Plan Note (Signed)
Chronic. Taking OTC daily supplement 5,000 units daily. Will check vitamin D level today.

## 2022-12-12 ENCOUNTER — Other Ambulatory Visit: Payer: Self-pay | Admitting: Nurse Practitioner

## 2022-12-12 DIAGNOSIS — R7989 Other specified abnormal findings of blood chemistry: Secondary | ICD-10-CM

## 2022-12-12 LAB — CBC WITH DIFFERENTIAL/PLATELET
Basophils Absolute: 0 10*3/uL (ref 0.0–0.1)
Basophils Relative: 0.5 % (ref 0.0–3.0)
Eosinophils Absolute: 0.2 10*3/uL (ref 0.0–0.7)
Eosinophils Relative: 2.5 % (ref 0.0–5.0)
HCT: 40 % (ref 36.0–46.0)
Hemoglobin: 13.4 g/dL (ref 12.0–15.0)
Lymphocytes Relative: 29.2 % (ref 12.0–46.0)
Lymphs Abs: 1.9 10*3/uL (ref 0.7–4.0)
MCHC: 33.4 g/dL (ref 30.0–36.0)
MCV: 79.4 fL (ref 78.0–100.0)
Monocytes Absolute: 0.6 10*3/uL (ref 0.1–1.0)
Monocytes Relative: 10.1 % (ref 3.0–12.0)
Neutro Abs: 3.7 10*3/uL (ref 1.4–7.7)
Neutrophils Relative %: 57.7 % (ref 43.0–77.0)
Platelets: 349 10*3/uL (ref 150.0–400.0)
RBC: 5.03 Mil/uL (ref 3.87–5.11)
RDW: 12.8 % (ref 11.5–15.5)
WBC: 6.3 10*3/uL (ref 4.0–10.5)

## 2022-12-12 LAB — LIPID PANEL
Cholesterol: 110 mg/dL (ref 0–200)
HDL: 43.5 mg/dL (ref >39.00)
LDL Cholesterol: 39 mg/dL (ref 0–99)
NonHDL: 66.39
Total CHOL/HDL Ratio: 3
Triglycerides: 135 mg/dL (ref 0.0–149.0)
VLDL: 27 mg/dL (ref 0.0–40.0)

## 2022-12-12 LAB — COMPREHENSIVE METABOLIC PANEL
ALT: 34 U/L (ref 0–35)
AST: 29 U/L (ref 0–37)
Albumin: 4 g/dL (ref 3.5–5.2)
Alkaline Phosphatase: 135 U/L — ABNORMAL HIGH (ref 39–117)
BUN: 7 mg/dL (ref 6–23)
CO2: 24 meq/L (ref 19–32)
Calcium: 9.9 mg/dL (ref 8.4–10.5)
Chloride: 101 meq/L (ref 96–112)
Creatinine, Ser: 0.35 mg/dL — ABNORMAL LOW (ref 0.40–1.20)
GFR: 137.75 mL/min (ref >60.00)
Glucose, Bld: 92 mg/dL (ref 70–99)
Potassium: 4 meq/L (ref 3.5–5.1)
Sodium: 135 meq/L (ref 135–145)
Total Bilirubin: 0.6 mg/dL (ref 0.2–1.2)
Total Protein: 7.5 g/dL (ref 6.0–8.3)

## 2022-12-12 LAB — HEMOGLOBIN A1C: Hgb A1c MFr Bld: 5 % (ref 4.6–6.5)

## 2022-12-12 NOTE — Progress Notes (Unsigned)
Future lab orders to check vitamin D

## 2022-12-13 ENCOUNTER — Ambulatory Visit
Admission: RE | Admit: 2022-12-13 | Discharge: 2022-12-13 | Disposition: A | Discharge status: Home / Self Care | Payer: 59 | Source: Ambulatory Visit | Attending: Nurse Practitioner | Admitting: Nurse Practitioner

## 2022-12-13 DIAGNOSIS — E040 Nontoxic diffuse goiter: Secondary | ICD-10-CM | POA: Diagnosis present

## 2022-12-13 DIAGNOSIS — E0590 Thyrotoxicosis, unspecified without thyrotoxic crisis or storm: Secondary | ICD-10-CM | POA: Insufficient documentation

## 2022-12-13 LAB — TSH+T4F+T3FREE+THYABS+TPO+VD25
Free T4: 4.36 ng/dL — ABNORMAL HIGH (ref 0.82–1.77)
T3, Free: 19.2 pg/mL — ABNORMAL HIGH (ref 2.0–4.4)
TSH: <0.005 u[IU]/mL — ABNORMAL LOW (ref 0.450–4.500)
Thyroglobulin Antibody: 719.9 [IU]/mL — ABNORMAL HIGH (ref 0.0–0.9)
Thyroperoxidase Ab SerPl-aCnc: >600 [IU]/mL — ABNORMAL HIGH (ref 0–34)
Vit D, 25-Hydroxy: 108 ng/mL — ABNORMAL HIGH (ref 30.0–100.0)

## 2022-12-14 ENCOUNTER — Ambulatory Visit: Admission: RE | Admit: 2022-12-14 | Payer: 59 | Source: Ambulatory Visit

## 2022-12-18 ENCOUNTER — Telehealth: Payer: Self-pay

## 2022-12-18 NOTE — Telephone Encounter (Signed)
-----   Message from [facility] sent at 12/18/2022  9:21 AM EDT ----- Please see if she has reached out to Endocrinology. She needs to follow up with them. I do not see an appointment scheduled.

## 2022-12-18 NOTE — Telephone Encounter (Signed)
Lvm for pt to give office a call back in regards to [place], see Arville Lime message below

## 2022-12-18 NOTE — Telephone Encounter (Signed)
Patient just called. I read her the message. She said she is going to call Endocrinology and schedule an appointment.

## 2023-01-10 ENCOUNTER — Encounter: Payer: Self-pay | Admitting: Nurse Practitioner

## 2023-01-14 ENCOUNTER — Other Ambulatory Visit (INDEPENDENT_AMBULATORY_CARE_PROVIDER_SITE_OTHER): Payer: 59

## 2023-01-14 ENCOUNTER — Encounter: Payer: Self-pay | Admitting: Nurse Practitioner

## 2023-01-14 DIAGNOSIS — R7989 Other specified abnormal findings of blood chemistry: Secondary | ICD-10-CM | POA: Diagnosis not present

## 2023-01-14 LAB — VITAMIN D 25 HYDROXY (VIT D DEFICIENCY, FRACTURES): VITD: 75.84 ng/mL (ref 30.00–100.00)

## 2023-01-15 ENCOUNTER — Other Ambulatory Visit: Payer: Self-pay | Admitting: Nurse Practitioner

## 2023-01-15 DIAGNOSIS — E0590 Thyrotoxicosis, unspecified without thyrotoxic crisis or storm: Secondary | ICD-10-CM

## 2023-03-14 ENCOUNTER — Encounter: Payer: Self-pay | Admitting: Nurse Practitioner

## 2023-03-14 ENCOUNTER — Other Ambulatory Visit (HOSPITAL_COMMUNITY): Payer: Self-pay

## 2023-03-14 ENCOUNTER — Ambulatory Visit: Payer: BC Managed Care – PPO | Admitting: Nurse Practitioner

## 2023-03-14 ENCOUNTER — Telehealth: Payer: Self-pay

## 2023-03-14 VITALS — BP 110/72 | HR 53 | Temp 98.2°F | Ht 64.0 in | Wt 204.4 lb

## 2023-03-14 DIAGNOSIS — R000 Tachycardia, unspecified: Secondary | ICD-10-CM | POA: Diagnosis not present

## 2023-03-14 DIAGNOSIS — E669 Obesity, unspecified: Secondary | ICD-10-CM | POA: Diagnosis not present

## 2023-03-14 DIAGNOSIS — E0500 Thyrotoxicosis with diffuse goiter without thyrotoxic crisis or storm: Secondary | ICD-10-CM

## 2023-03-14 MED ORDER — ZEPBOUND 7.5 MG/0.5ML ~~LOC~~ SOAJ: 7.5000 mg | SUBCUTANEOUS | 0 refills | Status: DC

## 2023-03-14 MED ORDER — ZEPBOUND 2.5 MG/0.5ML ~~LOC~~ SOAJ: 2.5000 mg | SUBCUTANEOUS | 0 refills | Status: DC

## 2023-03-14 MED ORDER — ZEPBOUND 5 MG/0.5ML ~~LOC~~ SOAJ: 5.0000 mg | SUBCUTANEOUS | 0 refills | Status: DC

## 2023-03-14 NOTE — Progress Notes (Signed)
 Leron Glance, NP-C Phone: [phone]  Shannon Newman is a 33 y.o. female who presents today for follow up.   Discussed the use of AI scribe software for clinical note transcription with the patient, who gave verbal consent to proceed.  History of Present Illness   The patient, with a history of hyperthyroidism and tachycardia, is scheduled for a total thyroidectomy tomorrow. They express nervousness about potential weight gain post-surgery. They have been on PTU 100mg  twice a day, which was decreased from three times a day due to improvement in thyroid  function. They also take Atenolol  for tachycardia, which was halved due to normalization of heart rate. Recently, they noticed their resting heart rate dropping into the forties, leading to a decision to discontinue Atenolol .  In terms of lifestyle, the patient has been adhering to a diet of 1250-1300 calories per day and exercising 4-5 days a week for 20-30 minutes. They initially lost almost 15 pounds, but the weight has fluctuated since then. They express interest in weight loss medication to overcome a perceived plateau, with a goal weight of 150 pounds.      Social History   Tobacco Use  Smoking Status Never  Smokeless Tobacco Never  Tobacco Comments   N/A    Current Outpatient Medications on File Prior to Visit  Medication Sig Dispense Refill   propylthiouracil (PTU) 50 MG tablet Take 100 mg by mouth 2 (two) times daily.     No current facility-administered medications on file prior to visit.    ROS see history of present illness  Objective  Physical Exam Vitals:   03/14/23 1308  BP: 110/72  Pulse: (!) 53  Temp: 98.2 F (36.8 C)  SpO2: 100%    BP Readings from Last 3 Encounters:  03/14/23 110/72  12/11/22 112/70  10/19/20 (!) 144/88   Wt Readings from Last 3 Encounters:  03/14/23 204 lb 6.4 oz (92.7 kg)  12/11/22 212 lb 9.6 oz (96.4 kg)  12/05/21 219 lb 4.8 oz (99.5 kg)    Physical Exam Constitutional:       General: She is not in acute distress.    Appearance: Normal appearance. She is obese.  HENT:     Head: Normocephalic.  Cardiovascular:     Rate and Rhythm: Normal rate and regular rhythm.     Heart sounds: Normal heart sounds.  Pulmonary:     Effort: Pulmonary effort is normal.     Breath sounds: Normal breath sounds.  Skin:    General: Skin is warm and dry.  Neurological:     General: No focal deficit present.     Mental Status: She is alert.  Psychiatric:        Mood and Affect: Mood normal.        Behavior: Behavior normal.    Assessment/Plan: Please see individual problem list.  Graves disease Assessment & Plan: Scheduled for total thyroidectomy tomorrow, they are currently on PTU 100mg  twice daily, a reduction from three times daily following an improvement in thyroid  function tests. We will continue PTU 100mg  twice daily and follow up with Endocrinology as scheduled.    Obesity (BMI 30-39.9) Assessment & Plan: They have successfully lost weight through diet and exercise but express concerns about potential weight gain post-thyroidectomy and the desire to lose more weight. We discussed the potential use of the weight loss medication Zepbound , pending insurance approval. We will initiate Zepbound  after the thyroidectomy, pending insurance approval. Counseled on the risk of pancreatitis and gallbladder disease. Discussed the  risk of nausea. Advised to discontinue the Zepbound  and contact us  immediately if they develop abdominal pain. If they develop excessive nausea they will contact us  right away. I discussed that medullary thyroid  cancer has been seen in rats studies. The patient confirmed no personal or family history of thyroid  cancer, parathyroid cancer, or adrenal gland cancer. Discussed that we thus far have not seen medullary thyroid  cancer result from use of this type of medication in humans. Advised to monitor the thyroid  area and contact us  for any lumps, swelling,  trouble swallowing, or any other changes in this area. Discussed goal weight loss of 1 to 2 pounds a week while on this medication. Discussed the importance of healthy diet, exercise and lifestyle modifications even with this medication.   Orders: -     Zepbound ; Inject 2.5 mg into the skin once a week.  Dispense: 2 mL; Refill: 0 -     Zepbound ; Inject 5 mg into the skin once a week.  Dispense: 2 mL; Refill: 0 -     Zepbound ; Inject 7.5 mg into the skin once a week.  Dispense: 2 mL; Refill: 0  Tachycardia Assessment & Plan: Their tachycardia is likely secondary to hyperthyroidism. They are on Atenolol  12.5mg  daily but are experiencing bradycardia since they have had improvement in thyroid  function, pulse 53 today in office.  We will discontinue Atenolol . They will continue to monitor heart rate at home.     Return in about 3 months (around 06/12/2023) for Follow up.   Leron Glance, NP-C [facility]

## 2023-03-14 NOTE — Assessment & Plan Note (Signed)
 Scheduled for total thyroidectomy tomorrow, they are currently on PTU 100mg  twice daily, a reduction from three times daily following an improvement in thyroid  function tests. We will continue PTU 100mg  twice daily and follow up with Endocrinology as scheduled.

## 2023-03-14 NOTE — Assessment & Plan Note (Addendum)
 They have successfully lost weight through diet and exercise but express concerns about potential weight gain post-thyroidectomy and the desire to lose more weight. We discussed the potential use of the weight loss medication Zepbound , pending insurance approval. We will initiate Zepbound  after the thyroidectomy, pending insurance approval. Counseled on the risk of pancreatitis and gallbladder disease. Discussed the risk of nausea. Advised to discontinue the Zepbound  and contact us  immediately if they develop abdominal pain. If they develop excessive nausea they will contact us  right away. I discussed that medullary thyroid  cancer has been seen in rats studies. The patient confirmed no personal or family history of thyroid  cancer, parathyroid cancer, or adrenal gland cancer. Discussed that we thus far have not seen medullary thyroid  cancer result from use of this type of medication in humans. Advised to monitor the thyroid  area and contact us  for any lumps, swelling, trouble swallowing, or any other changes in this area. Discussed goal weight loss of 1 to 2 pounds a week while on this medication. Discussed the importance of healthy diet, exercise and lifestyle modifications even with this medication.

## 2023-03-14 NOTE — Telephone Encounter (Signed)
 Pharmacy Patient Advocate Encounter  Received notification from [employer] that Prior Authorization for Zepbound  7.5 mg has been APPROVED from 03/13/23 to 10/09/23. Ran test claim, Copay is $80.00. This test claim was processed through [facility]- copay amounts may vary at other pharmacies due to pharmacy/plan contracts, or as the patient moves through the different stages of their insurance plan.   PA #/Case ID/Reference #: [id number]

## 2023-03-14 NOTE — Assessment & Plan Note (Signed)
 Their tachycardia is likely secondary to hyperthyroidism. They are on Atenolol  12.5mg  daily but are experiencing bradycardia since they have had improvement in thyroid  function, pulse 53 today in office.  We will discontinue Atenolol . They will continue to monitor heart rate at home.

## 2023-03-15 DIAGNOSIS — R0489 Hemorrhage from other sites in respiratory passages: Secondary | ICD-10-CM | POA: Diagnosis not present

## 2023-03-15 DIAGNOSIS — Y658 Other specified misadventures during surgical and medical care: Secondary | ICD-10-CM | POA: Diagnosis not present

## 2023-03-15 DIAGNOSIS — J398 Other specified diseases of upper respiratory tract: Secondary | ICD-10-CM | POA: Diagnosis not present

## 2023-03-15 DIAGNOSIS — E0590 Thyrotoxicosis, unspecified without thyrotoxic crisis or storm: Secondary | ICD-10-CM | POA: Diagnosis not present

## 2023-03-15 DIAGNOSIS — S11029A Unspecified open wound of trachea, initial encounter: Secondary | ICD-10-CM | POA: Diagnosis not present

## 2023-03-15 DIAGNOSIS — E063 Autoimmune thyroiditis: Secondary | ICD-10-CM | POA: Diagnosis not present

## 2023-03-15 DIAGNOSIS — E0500 Thyrotoxicosis with diffuse goiter without thyrotoxic crisis or storm: Secondary | ICD-10-CM | POA: Diagnosis not present

## 2023-03-16 DIAGNOSIS — E063 Autoimmune thyroiditis: Secondary | ICD-10-CM | POA: Diagnosis not present

## 2023-03-16 DIAGNOSIS — S11029A Unspecified open wound of trachea, initial encounter: Secondary | ICD-10-CM | POA: Diagnosis not present

## 2023-03-16 DIAGNOSIS — J398 Other specified diseases of upper respiratory tract: Secondary | ICD-10-CM | POA: Diagnosis not present

## 2023-03-16 DIAGNOSIS — E0500 Thyrotoxicosis with diffuse goiter without thyrotoxic crisis or storm: Secondary | ICD-10-CM | POA: Diagnosis not present

## 2023-03-16 DIAGNOSIS — Y658 Other specified misadventures during surgical and medical care: Secondary | ICD-10-CM | POA: Diagnosis not present

## 2023-03-16 DIAGNOSIS — R0489 Hemorrhage from other sites in respiratory passages: Secondary | ICD-10-CM | POA: Diagnosis not present

## 2023-03-19 DIAGNOSIS — E890 Postprocedural hypothyroidism: Secondary | ICD-10-CM | POA: Diagnosis not present

## 2023-03-19 DIAGNOSIS — Z4889 Encounter for other specified surgical aftercare: Secondary | ICD-10-CM | POA: Diagnosis not present

## 2023-03-19 DIAGNOSIS — E0500 Thyrotoxicosis with diffuse goiter without thyrotoxic crisis or storm: Secondary | ICD-10-CM | POA: Diagnosis not present

## 2023-03-19 DIAGNOSIS — Z9889 Other specified postprocedural states: Secondary | ICD-10-CM | POA: Diagnosis not present

## 2023-03-19 DIAGNOSIS — Z79890 Hormone replacement therapy (postmenopausal): Secondary | ICD-10-CM | POA: Diagnosis not present

## 2023-04-02 DIAGNOSIS — E890 Postprocedural hypothyroidism: Secondary | ICD-10-CM | POA: Diagnosis not present

## 2023-04-02 DIAGNOSIS — E0500 Thyrotoxicosis with diffuse goiter without thyrotoxic crisis or storm: Secondary | ICD-10-CM | POA: Diagnosis not present

## 2023-04-13 ENCOUNTER — Encounter: Payer: Self-pay | Admitting: Nurse Practitioner

## 2023-04-24 ENCOUNTER — Telehealth: Payer: Self-pay

## 2023-04-24 ENCOUNTER — Other Ambulatory Visit: Payer: Self-pay | Admitting: Nurse Practitioner

## 2023-04-24 NOTE — Telephone Encounter (Signed)
Pt is needing a prior auth for zepbound 5 mg dose

## 2023-04-25 ENCOUNTER — Other Ambulatory Visit (HOSPITAL_COMMUNITY): Payer: Self-pay

## 2023-04-25 ENCOUNTER — Telehealth: Payer: Self-pay

## 2023-04-25 NOTE — Telephone Encounter (Signed)
Pharmacy Patient Advocate Encounter   Received notification from Fax that prior authorization for Zepbound 5MG /0.5ML pen-injectors is required/requested.   Insurance verification completed.   The patient is insured through [employer] .   Per test claim: PA required and submitted KEY/EOC/Request #: [id number] CANCELLED due to Drug is not covered by plan-member must engage with [facility].  Called [facility] at [phone], per representative, patient must engage with [facility] by calling the 800 number on the back of her insurance card or [email or web address]

## 2023-04-29 NOTE — Telephone Encounter (Signed)
PA team has been informed

## 2023-04-29 NOTE — Telephone Encounter (Signed)
Pt reached out to [facility] health and needs PA to be completed I will attach pts response below:     Hi there. I completed the above steps, connected with [facility] and finished registration with them on my end. I contacted [facility] [employer] and they have stated further prior authorization is required and to have this initiated to contact my doctors office as I cannot do it myself. May I please have someone attempt to initiate that PA for the Zepbound 5mg  Pens?

## 2023-05-01 ENCOUNTER — Telehealth: Payer: Self-pay

## 2023-05-01 NOTE — Telephone Encounter (Signed)
Pharmacy Patient Advocate Encounter   Received notification from Pt Calls Messages that prior authorization for Zepbound 5MG /0.5ML pen-injectors is required/requested.   Insurance verification completed.   The patient is insured through [employer] .   Per test claim: PA required; PA submitted to above mentioned insurance via CoverMyMeds Key/confirmation #/EOC [facility] Status is pending

## 2023-05-01 NOTE — Telephone Encounter (Signed)
Signing off on duplicate request

## 2023-05-01 NOTE — Telephone Encounter (Signed)
PA request has been  Resubmitted . New Encounter created for follow up. For additional info see Pharmacy Prior Auth telephone encounter from 05/01/23.

## 2023-05-06 ENCOUNTER — Other Ambulatory Visit (HOSPITAL_COMMUNITY): Payer: Self-pay

## 2023-05-06 NOTE — Telephone Encounter (Signed)
 Pharmacy Patient Advocate Encounter  Received notification from [employer] that Prior Authorization for Zepbound 5MG /0.5ML pen-injectors  has been APPROVED from 04/01/23 to 12/31/23. Ran test claim, Copay is $0. This test claim was processed through [facility]- copay amounts may vary at other pharmacies due to pharmacy/plan contracts, or as the patient moves through the different stages of their insurance plan.   PA #/Case ID/Reference #: [id number]

## 2023-05-13 DIAGNOSIS — L659 Nonscarring hair loss, unspecified: Secondary | ICD-10-CM | POA: Diagnosis not present

## 2023-05-13 DIAGNOSIS — E890 Postprocedural hypothyroidism: Secondary | ICD-10-CM | POA: Diagnosis not present

## 2023-06-10 ENCOUNTER — Other Ambulatory Visit: Payer: Self-pay | Admitting: Nurse Practitioner

## 2023-06-10 DIAGNOSIS — E669 Obesity, unspecified: Secondary | ICD-10-CM

## 2023-06-12 ENCOUNTER — Ambulatory Visit (INDEPENDENT_AMBULATORY_CARE_PROVIDER_SITE_OTHER): Payer: BC Managed Care – PPO | Admitting: Nurse Practitioner

## 2023-06-12 VITALS — BP 108/70 | HR 62 | Temp 98.3°F | Ht 64.0 in | Wt 178.2 lb

## 2023-06-12 DIAGNOSIS — E611 Iron deficiency: Secondary | ICD-10-CM | POA: Diagnosis not present

## 2023-06-12 DIAGNOSIS — E890 Postprocedural hypothyroidism: Secondary | ICD-10-CM | POA: Diagnosis not present

## 2023-06-12 DIAGNOSIS — E669 Obesity, unspecified: Secondary | ICD-10-CM | POA: Diagnosis not present

## 2023-06-12 NOTE — Progress Notes (Unsigned)
 Shannon Dicker, NP-C Phone: [phone]  Shannon Newman is a 33 y.o. female who presents today for follow up.   Discussed the use of AI scribe software for clinical note transcription with the patient, who gave verbal consent to proceed.  History of Present Illness   Shannon Newman is a 33 year old female who presents for a three-month follow-up after thyroid surgery.  She underwent thyroid surgery on January 3rd, which was complicated by a trachea injury and significant blood loss, resulting in a four-hour surgery and a two-and-a-half-day hospital stay. She is healing well postoperatively.  Post-surgery, she experiences symptoms of hypothyroidism, including hair thinning, dry skin, and constipation. Her thyroid levels remain high despite adjustments in her levothyroxine dosage. Initially started on levothyroxine post-surgery, her dose was recently decreased to 125 mcg. Hair loss has slowed since the dose adjustment but remains a significant concern. Dry skin and constipation have improved. She feels cold more often, a change from her pre-surgery state where she was frequently warm. No heart palpitations or significant temperature regulation issues.  She is currently on Zepbound, having started the 7.5 mg dose recently, which has led to a decreased appetite and a weight loss of 26 pounds. No nausea or vomiting, but a significant reduction in appetite led to minimal food intake initially. She is working on maintaining a balanced diet and exercises 20-30 minutes daily. Her goal weight is 150 pounds.  She takes iron supplements three times a week due to low ferritin levels, which were checked following concerns about hair loss. Her ferritin was noted to be low at 11, though her iron levels were normal. She has been on iron supplements since March 17th.      Social History   Tobacco Use  Smoking Status Never  Smokeless Tobacco Never  Tobacco Comments   N/A    Current Outpatient Medications on  File Prior to Visit  Medication Sig Dispense Refill   ferrous sulfate 325 (65 FE) MG tablet Take 325 mg by mouth daily with breakfast.     levothyroxine (SYNTHROID) 125 MCG tablet Take 125 mcg by mouth.     Rimegepant Sulfate (NURTEC PO) Take by mouth.     tirzepatide (ZEPBOUND) 7.5 MG/0.5ML Pen Inject 7.5 mg into the skin once a week. 2 mL 0   No current facility-administered medications on file prior to visit.     ROS see history of present illness  Objective  Physical Exam Vitals:   06/12/23 1305  BP: 108/70  Pulse: 62  Temp: 98.3 F (36.8 C)  SpO2: 95%    BP Readings from Last 3 Encounters:  06/12/23 108/70  03/14/23 110/72  12/11/22 112/70   Wt Readings from Last 3 Encounters:  06/12/23 178 lb 3.2 oz (80.8 kg)  03/14/23 204 lb 6.4 oz (92.7 kg)  12/11/22 212 lb 9.6 oz (96.4 kg)    Physical Exam Constitutional:      General: She is not in acute distress.    Appearance: Normal appearance.  HENT:     Head: Normocephalic.  Cardiovascular:     Rate and Rhythm: Normal rate and regular rhythm.     Heart sounds: Normal heart sounds.  Pulmonary:     Effort: Pulmonary effort is normal.     Breath sounds: Normal breath sounds.  Skin:    General: Skin is warm and dry.  Neurological:     General: No focal deficit present.     Mental Status: She is alert.  Psychiatric:  Mood and Affect: Mood normal.        Behavior: Behavior normal.     Assessment/Plan: Please see individual problem list.  Post-surgical hypothyroidism Assessment & Plan: Three months post-thyroidectomy, she experiences hypothyroid symptoms. Levothyroxine has been adjusted to 125 mcg. Hair loss is the most bothersome symptom but has slightly improved, with other symptoms also improving. Follow up with Endocrinology as scheduled. Consider dermatology referral if hair thinning persists. Monitor alkaline phosphatase and creatinine levels.    Obesity (BMI 30-39.9) Assessment & Plan: She is on  Zepbound for weight management, increased to 7.5 mg weekly. Appetite has decreased without nausea or vomiting, but constipation is noted. She has lost 26 pounds, with a goal weight of 150 pounds. Emphasize maintaining muscle mass and adequate nutrition. Continue Zepbound 7.5 mg for three more weeks, then increase to 10 mg. Encourage a balanced diet and regular exercise for 20-30 minutes daily. Plan follow-up in six weeks to assess progress and adjust Zepbound dosage if necessary.   Orders: -     Zepbound; Inject 10 mg into the skin once a week.  Dispense: 2 mL; Refill: 1  Iron deficiency Assessment & Plan: She has low ferritin with normal iron levels and takes iron supplements three times a week. Check ferritin and complete blood count in six weeks. Continue iron supplementation three times a week.     Return in about 6 weeks (around 07/24/2023) for Follow up.   Shannon Dicker, NP-C [facility]

## 2023-06-13 ENCOUNTER — Encounter: Payer: Self-pay | Admitting: Nurse Practitioner

## 2023-06-13 DIAGNOSIS — E890 Postprocedural hypothyroidism: Secondary | ICD-10-CM | POA: Insufficient documentation

## 2023-06-13 DIAGNOSIS — E611 Iron deficiency: Secondary | ICD-10-CM | POA: Insufficient documentation

## 2023-06-13 MED ORDER — ZEPBOUND 10 MG/0.5ML ~~LOC~~ SOAJ: 10.0000 mg | SUBCUTANEOUS | 1 refills | Status: DC

## 2023-06-13 NOTE — Assessment & Plan Note (Signed)
 Three months post-thyroidectomy, she experiences hypothyroid symptoms. Levothyroxine has been adjusted to 125 mcg. Hair loss is the most bothersome symptom but has slightly improved, with other symptoms also improving. Follow up with Endocrinology as scheduled. Consider dermatology referral if hair thinning persists. Monitor alkaline phosphatase and creatinine levels.

## 2023-06-13 NOTE — Assessment & Plan Note (Signed)
 She has low ferritin with normal iron levels and takes iron supplements three times a week. Check ferritin and complete blood count in six weeks. Continue iron supplementation three times a week.

## 2023-06-13 NOTE — Assessment & Plan Note (Signed)
 She is on Zepbound for weight management, increased to 7.5 mg weekly. Appetite has decreased without nausea or vomiting, but constipation is noted. She has lost 26 pounds, with a goal weight of 150 pounds. Emphasize maintaining muscle mass and adequate nutrition. Continue Zepbound 7.5 mg for three more weeks, then increase to 10 mg. Encourage a balanced diet and regular exercise for 20-30 minutes daily. Plan follow-up in six weeks to assess progress and adjust Zepbound dosage if necessary.

## 2023-07-06 ENCOUNTER — Other Ambulatory Visit: Payer: Self-pay | Admitting: Nurse Practitioner

## 2023-07-06 DIAGNOSIS — E669 Obesity, unspecified: Secondary | ICD-10-CM

## 2023-07-08 NOTE — Telephone Encounter (Signed)
Called and left detailed vm

## 2023-07-15 DIAGNOSIS — L659 Nonscarring hair loss, unspecified: Secondary | ICD-10-CM | POA: Diagnosis not present

## 2023-07-15 DIAGNOSIS — E890 Postprocedural hypothyroidism: Secondary | ICD-10-CM | POA: Diagnosis not present

## 2023-07-17 ENCOUNTER — Encounter (HOSPITAL_COMMUNITY): Payer: Self-pay

## 2023-07-24 ENCOUNTER — Ambulatory Visit: Admitting: Nurse Practitioner

## 2023-07-24 ENCOUNTER — Encounter: Payer: Self-pay | Admitting: Nurse Practitioner

## 2023-07-24 VITALS — BP 110/67 | Temp 98.7°F | Wt 168.6 lb

## 2023-07-24 DIAGNOSIS — E890 Postprocedural hypothyroidism: Secondary | ICD-10-CM

## 2023-07-24 DIAGNOSIS — L659 Nonscarring hair loss, unspecified: Secondary | ICD-10-CM | POA: Diagnosis not present

## 2023-07-24 DIAGNOSIS — E663 Overweight: Secondary | ICD-10-CM

## 2023-07-24 NOTE — Assessment & Plan Note (Signed)
 Managed with Levothyroxine 125 mcg daily. TSH 0.16 last week, no adjustment to medication per Endo. Continues to have hair thinning. Continue Levothyroxine 125 mcg. Follow up with endocrinology in July. Refer to dermatology for hair concerns.

## 2023-07-24 NOTE — Assessment & Plan Note (Signed)
 Likely related to prior hyperthyroid. Not improving. We will refer to Derm for further evaluation.

## 2023-07-24 NOTE — Progress Notes (Signed)
 Bluford Burkitt, NP-C Phone: [phone]  Shannon Newman is a 33 y.o. female who presents today for follow up.   Discussed the use of AI scribe software for clinical note transcription with the patient, who gave verbal consent to proceed.  History of Present Illness   Shannon Newman is a 33 year old female who presents for a six-week follow-up for weight management and thyroid  monitoring.  She has lost ten pounds since her last visit and is currently on Zepbound  10 mg, taken weekly. Her weight has fluctuated between 176 to 172 pounds over the last few weeks, with further weight loss noted in the past week. Her diet includes three meals a day with a focus on balanced macronutrients, approximately 35% protein, tracked using an app. She engages in regular physical activity, primarily walking on a treadmill with longer strides and muscle engagement, and has started lifting weights. She drinks plenty of water and aims for higher protein and fiber intake. She is enrolled in a weight management program through her insurance, requiring her to use an app and weigh in four times a month, with medication coverage contingent on meeting these requirements.  Regarding her thyroid  condition, she saw her endocrinologist last week and had labs done. Her TSH was slightly low, but her medication was not adjusted. She continues on 125 micrograms of her thyroid  medication and will have a follow-up in July. She reports improvement in dry skin symptoms, although she experienced a sunburn with blistering and itching on her arms and neck, which has started to resolve. No heart palpitations or temperature intolerance.      Social History   Tobacco Use  Smoking Status Never  Smokeless Tobacco Never  Tobacco Comments   N/A    Current Outpatient Medications on File Prior to Visit  Medication Sig Dispense Refill   ferrous sulfate 325 (65 FE) MG tablet Take 325 mg by mouth daily with breakfast.     levothyroxine  (SYNTHROID) 125 MCG tablet Take 125 mcg by mouth.     tirzepatide  (ZEPBOUND ) 10 MG/0.5ML Pen Inject 10 mg into the skin once a week. 2 mL 1   No current facility-administered medications on file prior to visit.     ROS see history of present illness  Objective  Physical Exam Vitals:   07/24/23 1307  BP: 110/67  Temp: 98.7 F (37.1 C)    BP Readings from Last 3 Encounters:  07/24/23 110/67  06/12/23 108/70  03/14/23 110/72   Wt Readings from Last 3 Encounters:  07/24/23 168 lb 9.6 oz (76.5 kg)  06/12/23 178 lb 3.2 oz (80.8 kg)  03/14/23 204 lb 6.4 oz (92.7 kg)    Physical Exam Constitutional:      General: She is not in acute distress.    Appearance: Normal appearance.  HENT:     Head: Normocephalic.  Cardiovascular:     Rate and Rhythm: Normal rate and regular rhythm.     Heart sounds: Normal heart sounds.  Pulmonary:     Effort: Pulmonary effort is normal.     Breath sounds: Normal breath sounds.  Skin:    General: Skin is warm and dry.  Neurological:     General: No focal deficit present.     Mental Status: She is alert.  Psychiatric:        Mood and Affect: Mood normal.        Behavior: Behavior normal.      Assessment/Plan: Please see individual problem list.  Post-surgical hypothyroidism Assessment &  Plan: Managed with Levothyroxine 125 mcg daily. TSH 0.16 last week, no adjustment to medication per Endo. Continues to have hair thinning. Continue Levothyroxine 125 mcg. Follow up with endocrinology in July. Refer to dermatology for hair concerns.    Hair thinning Assessment & Plan: Likely related to prior hyperthyroid. Not improving. We will refer to Derm for further evaluation.   Orders: -     Ambulatory referral to Dermatology  Overweight (BMI 25.0-29.9) Assessment & Plan: She has lost 36 pounds total with ongoing management. Starting BMI- 35. The current regimen includes a balanced diet and regular exercise. Continue Zepbound  10 mg dose for  5 weeks. In 4 weeks, send a MyChart message to assess weight and decide on any dose adjustment. Consider increasing to 12.5 mg if weight loss plateaus. Monitor insurance coverage requirements. Encourage maintaining a balanced diet and regular exercise.     Return in about 3 months (around 10/24/2023) for Follow up.   Bluford Burkitt, NP-C [facility] - [facility]

## 2023-07-24 NOTE — Assessment & Plan Note (Signed)
 She has lost 36 pounds total with ongoing management. Starting BMI- 35. The current regimen includes a balanced diet and regular exercise. Continue Zepbound  10 mg dose for 5 weeks. In 4 weeks, send a MyChart message to assess weight and decide on any dose adjustment. Consider increasing to 12.5 mg if weight loss plateaus. Monitor insurance coverage requirements. Encourage maintaining a balanced diet and regular exercise.

## 2023-08-22 ENCOUNTER — Other Ambulatory Visit: Payer: Self-pay | Admitting: Nurse Practitioner

## 2023-08-22 DIAGNOSIS — E669 Obesity, unspecified: Secondary | ICD-10-CM

## 2023-08-22 MED ORDER — ZEPBOUND 10 MG/0.5ML ~~LOC~~ SOAJ: 10.0000 mg | SUBCUTANEOUS | 0 refills | Status: DC

## 2023-09-19 DIAGNOSIS — L659 Nonscarring hair loss, unspecified: Secondary | ICD-10-CM | POA: Diagnosis not present

## 2023-09-19 DIAGNOSIS — E890 Postprocedural hypothyroidism: Secondary | ICD-10-CM | POA: Diagnosis not present

## 2023-10-25 ENCOUNTER — Ambulatory Visit: Admitting: Nurse Practitioner

## 2023-10-29 DIAGNOSIS — Z1151 Encounter for screening for human papillomavirus (HPV): Secondary | ICD-10-CM | POA: Diagnosis not present

## 2023-10-29 DIAGNOSIS — Z6827 Body mass index (BMI) 27.0-27.9, adult: Secondary | ICD-10-CM | POA: Diagnosis not present

## 2023-10-29 DIAGNOSIS — Z113 Encounter for screening for infections with a predominantly sexual mode of transmission: Secondary | ICD-10-CM | POA: Diagnosis not present

## 2023-10-29 DIAGNOSIS — R8290 Unspecified abnormal findings in urine: Secondary | ICD-10-CM | POA: Diagnosis not present

## 2023-10-29 DIAGNOSIS — Z01419 Encounter for gynecological examination (general) (routine) without abnormal findings: Secondary | ICD-10-CM | POA: Diagnosis not present

## 2023-10-29 DIAGNOSIS — Z124 Encounter for screening for malignant neoplasm of cervix: Secondary | ICD-10-CM | POA: Diagnosis not present

## 2023-11-01 ENCOUNTER — Ambulatory Visit (INDEPENDENT_AMBULATORY_CARE_PROVIDER_SITE_OTHER): Admitting: Nurse Practitioner

## 2023-11-01 VITALS — BP 100/68 | HR 67 | Temp 98.1°F | Ht 64.0 in | Wt 155.8 lb

## 2023-11-01 DIAGNOSIS — E890 Postprocedural hypothyroidism: Secondary | ICD-10-CM | POA: Diagnosis not present

## 2023-11-01 DIAGNOSIS — E663 Overweight: Secondary | ICD-10-CM | POA: Diagnosis not present

## 2023-11-01 MED ORDER — ZEPBOUND 12.5 MG/0.5ML ~~LOC~~ SOAJ: 12.5000 mg | SUBCUTANEOUS | 2 refills | Status: DC

## 2023-11-01 NOTE — Progress Notes (Signed)
 Leron Glance, NP-C Phone: [phone]  Shannon Newman is a 33 y.o. female who presents today for follow up.   Discussed the use of AI scribe software for clinical note transcription with the patient, who gave verbal consent to proceed.  History of Present Illness   Shannon Newman is a 33 year old female who presents for weight management and thyroid  follow-up.  She has been actively working on weight loss, currently on Zpebound 10 mg weekly, successfully losing 49 pounds since January, reducing her weight from 204 pounds to 155 pounds. Recently, she has experienced a plateau in her weight loss and aims to reach 150 pounds, which is five pounds away from her current weight.  She is currently taking Levothyroxine 125 mcg daily for thyroid  management. In July, she consulted an endocrinologist, and her TSH levels were within the normal range. No heart palpitations or temperature regulation issues, but she notes persistent coldness in her hands and feet, a change from her previous state where she was always hot. She has adjusted her home temperature settings accordingly.      Social History   Tobacco Use  Smoking Status Never  Smokeless Tobacco Never  Tobacco Comments   N/A    Current Outpatient Medications on File Prior to Visit  Medication Sig Dispense Refill   ferrous sulfate 325 (65 FE) MG tablet Take 325 mg by mouth daily with breakfast.     levothyroxine (SYNTHROID) 125 MCG tablet Take 125 mcg by mouth.     norgestimate-ethinyl estradiol (SPRINTEC 28) 0.25-35 MG-MCG tablet Take 1 tablet by mouth daily.     No current facility-administered medications on file prior to visit.     ROS see history of present illness  Objective  Physical Exam Vitals:   11/01/23 1609  BP: 100/68  Pulse: 67  Temp: 98.1 F (36.7 C)  SpO2: 96%    BP Readings from Last 3 Encounters:  11/01/23 100/68  07/24/23 110/67  06/12/23 108/70   Wt Readings from Last 3 Encounters:  11/01/23 155 lb  12.8 oz (70.7 kg)  07/24/23 168 lb 9.6 oz (76.5 kg)  06/12/23 178 lb 3.2 oz (80.8 kg)    Physical Exam Constitutional:      General: She is not in acute distress.    Appearance: Normal appearance.  HENT:     Head: Normocephalic.  Cardiovascular:     Rate and Rhythm: Normal rate and regular rhythm.     Heart sounds: Normal heart sounds.  Pulmonary:     Effort: Pulmonary effort is normal.     Breath sounds: Normal breath sounds.  Skin:    General: Skin is warm and dry.  Neurological:     General: No focal deficit present.     Mental Status: She is alert.  Psychiatric:        Mood and Affect: Mood normal.        Behavior: Behavior normal.     Assessment/Plan: Please see individual problem list.  Overweight (BMI 25.0-29.9) Assessment & Plan: She lost 49 pounds on Zepbound  since January, now weighing 155 pounds with a BMI of 26, plateauing 5 pounds from her target weight of 150 pounds. Increase medication dosage to 12.5. Schedule follow-up in 6 weeks to assess progress. Monitor weight and BMI for healthy weight loss and maintenance. Consider adjusting medication dosage to every other week or reducing back to 10 mg weekly once the target is reached. Encourage continued healthy diet and regular exercise.   Orders: -  Zepbound ; Inject 12.5 mg into the skin once a week.  Dispense: 2 mL; Refill: 2  Post-surgical hypothyroidism Assessment & Plan: On Levothyroxine 125 mcg with normal TSH levels. She reports cold hands and feet, a change from previously feeling hot. Denies heart palpitations or any other symptoms. Continue Levothyroxine 125 mcg. Follow up with Endo as scheduled.       Return in about 6 weeks (around 12/13/2023) for Follow up.   Leron Glance, NP-C [facility]

## 2023-11-14 ENCOUNTER — Encounter: Payer: Self-pay | Admitting: Nurse Practitioner

## 2023-11-14 NOTE — Assessment & Plan Note (Signed)
 On Levothyroxine 125 mcg with normal TSH levels. She reports cold hands and feet, a change from previously feeling hot. Denies heart palpitations or any other symptoms. Continue Levothyroxine 125 mcg. Follow up with Endo as scheduled.

## 2023-11-14 NOTE — Assessment & Plan Note (Signed)
 She lost 49 pounds on Zepbound  since January, now weighing 155 pounds with a BMI of 26, plateauing 5 pounds from her target weight of 150 pounds. Increase medication dosage to 12.5. Schedule follow-up in 6 weeks to assess progress. Monitor weight and BMI for healthy weight loss and maintenance. Consider adjusting medication dosage to every other week or reducing back to 10 mg weekly once the target is reached. Encourage continued healthy diet and regular exercise.

## 2023-12-03 ENCOUNTER — Other Ambulatory Visit (HOSPITAL_COMMUNITY): Payer: Self-pay

## 2023-12-09 DIAGNOSIS — E890 Postprocedural hypothyroidism: Secondary | ICD-10-CM | POA: Diagnosis not present

## 2023-12-09 DIAGNOSIS — L659 Nonscarring hair loss, unspecified: Secondary | ICD-10-CM | POA: Diagnosis not present

## 2023-12-12 ENCOUNTER — Encounter: Payer: Self-pay | Admitting: Nurse Practitioner

## 2023-12-12 ENCOUNTER — Ambulatory Visit (INDEPENDENT_AMBULATORY_CARE_PROVIDER_SITE_OTHER): Admitting: Nurse Practitioner

## 2023-12-12 VITALS — BP 104/66 | HR 71 | Temp 98.2°F | Ht 64.0 in | Wt 155.8 lb

## 2023-12-12 DIAGNOSIS — E663 Overweight: Secondary | ICD-10-CM | POA: Diagnosis not present

## 2023-12-12 DIAGNOSIS — R29898 Other symptoms and signs involving the musculoskeletal system: Secondary | ICD-10-CM | POA: Diagnosis not present

## 2023-12-12 DIAGNOSIS — J3489 Other specified disorders of nose and nasal sinuses: Secondary | ICD-10-CM | POA: Diagnosis not present

## 2023-12-12 DIAGNOSIS — E890 Postprocedural hypothyroidism: Secondary | ICD-10-CM | POA: Diagnosis not present

## 2023-12-12 MED ORDER — ZEPBOUND 12.5 MG/0.5ML ~~LOC~~ SOAJ: 12.5000 mg | SUBCUTANEOUS | 2 refills | Status: DC

## 2023-12-12 NOTE — Assessment & Plan Note (Signed)
 She has chronic rhinorrhea with no prior use of nasal sprays, possibly due to chronic nasal drip or cold exposure. Recommend a trial of over-the-counter nasal sprays such as Flonase or Nasacort. Consider prescription nasal spray if over-the-counter options are ineffective.

## 2023-12-12 NOTE — Assessment & Plan Note (Signed)
 TSH is normal, but T4 is elevated on levothyroxine 125 mcg. An endocrinology follow-up is scheduled.

## 2023-12-12 NOTE — Progress Notes (Signed)
 Leron Glance, NP-C Phone: [phone]  Shannon Newman is a 33 y.o. female who presents today for follow up.   Discussed the use of AI scribe software for clinical note transcription with the patient, who gave verbal consent to proceed.  History of Present Illness   Shannon Newman is a 33 year old female who presents for weight management and knee issues.  She has been on Zepbound  12.5 mg for six weeks to aid in weight management. She maintains her weight but acknowledges a lapse in diet and exercise due to lifestyle changes. She reports that she has hit her goal weight of 150 pounds twice and is currently fluctuating between her goal weight and about five pounds above it. She wants to continue the current medication dosage for at least another four weeks to help maintain her weight loss.  She has a history of thyroid  issues and recently had labs done with her endocrinologist. Her TSH levels were normal, but her T4 was high. She is currently on Levothyroxine 125 mcg daily and has a follow-up appointment scheduled for October 20th.  She is experiencing increased hair shedding and has started taking oral minoxidil, which she obtained from [facility], where she also gets her birth control. She has not yet seen a dermatologist but has an appointment scheduled for November 13th.  She reports a chronic issue with her left knee, which 'pops out of place' when bent too far back. This has been occurring more frequently and is associated with a bump below the knee that appeared after she started exercising. No pain with extension but notes the need to 'fling her leg forward' to reposition it. This issue has been present since middle school but has become more bothersome recently.  She also reports a persistent runny nose, which she attributes to being cold all the time. She has not tried any nasal sprays yet.      Social History   Tobacco Use  Smoking Status Never  Smokeless Tobacco Never  Tobacco  Comments   N/A    Current Outpatient Medications on File Prior to Visit  Medication Sig Dispense Refill   ferrous sulfate 325 (65 FE) MG tablet Take 325 mg by mouth daily with breakfast.     levothyroxine (SYNTHROID) 125 MCG tablet Take 125 mcg by mouth.     minoxidil (LONITEN) 2.5 MG tablet Take 2.5 mg by mouth.     norgestimate-ethinyl estradiol (SPRINTEC 28) 0.25-35 MG-MCG tablet Take 1 tablet by mouth daily.     No current facility-administered medications on file prior to visit.     ROS see history of present illness  Objective  Physical Exam Vitals:   12/12/23 1512  BP: 104/66  Pulse: 71  Temp: 98.2 F (36.8 C)  SpO2: 95%    BP Readings from Last 3 Encounters:  12/12/23 104/66  11/01/23 100/68  07/24/23 110/67   Wt Readings from Last 3 Encounters:  12/12/23 155 lb 12.8 oz (70.7 kg)  11/01/23 155 lb 12.8 oz (70.7 kg)  07/24/23 168 lb 9.6 oz (76.5 kg)    Physical Exam Constitutional:      General: She is not in acute distress.    Appearance: Normal appearance.  HENT:     Head: Normocephalic.  Cardiovascular:     Rate and Rhythm: Normal rate and regular rhythm.     Heart sounds: Normal heart sounds.  Pulmonary:     Effort: Pulmonary effort is normal.     Breath sounds: Normal breath sounds.  Musculoskeletal:     Right knee: Normal.     Left knee: No swelling, deformity or crepitus. Normal range of motion. No tenderness.  Skin:    General: Skin is warm and dry.  Neurological:     General: No focal deficit present.     Mental Status: She is alert.  Psychiatric:        Mood and Affect: Mood normal.        Behavior: Behavior normal.      Assessment/Plan: Please see individual problem list.  Popping of left knee joint Assessment & Plan: She experiences increased frequency of chronic left knee instability with mechanical symptoms. No swelling, decreased strength or decreased ROM on exam. Refer to orthopedics for evaluation.  Orders: -      Ambulatory referral to Orthopedics  Rhinorrhea Assessment & Plan: She has chronic rhinorrhea with no prior use of nasal sprays, possibly due to chronic nasal drip or cold exposure. Recommend a trial of over-the-counter nasal sprays such as Flonase or Nasacort. Consider prescription nasal spray if over-the-counter options are ineffective.    Overweight (BMI 25.0-29.9) Assessment & Plan: She lost 49 pounds on Zepbound  since January, now weighing 155 pounds with a BMI of 26. Her weight is stable on Zepbound  12.5 mg, five pounds from the target. Continue Zepbound  12.5 mg for three months. Encourage adherence to diet and exercise regimen. Consider reducing Zepbound  dosage or frequency if weight maintenance remains stable. We will continue to monitor.   Orders: -     Zepbound ; Inject 12.5 mg into the skin once a week.  Dispense: 2 mL; Refill: 2  Post-surgical hypothyroidism Assessment & Plan: TSH is normal, but T4 is elevated on levothyroxine 125 mcg. An endocrinology follow-up is scheduled.      Return in about 3 months (around 03/13/2024) for Follow up.   Leron Glance, NP-C [facility]

## 2023-12-12 NOTE — Assessment & Plan Note (Signed)
 She lost 49 pounds on Zepbound  since January, now weighing 155 pounds with a BMI of 26. Her weight is stable on Zepbound  12.5 mg, five pounds from the target. Continue Zepbound  12.5 mg for three months. Encourage adherence to diet and exercise regimen. Consider reducing Zepbound  dosage or frequency if weight maintenance remains stable. We will continue to monitor.

## 2023-12-12 NOTE — Assessment & Plan Note (Signed)
 She experiences increased frequency of chronic left knee instability with mechanical symptoms. No swelling, decreased strength or decreased ROM on exam. Refer to orthopedics for evaluation.

## 2023-12-18 ENCOUNTER — Other Ambulatory Visit (HOSPITAL_COMMUNITY): Payer: Self-pay

## 2023-12-18 ENCOUNTER — Telehealth: Payer: Self-pay | Admitting: Pharmacy Technician

## 2023-12-18 NOTE — Telephone Encounter (Signed)
 Pharmacy Patient Advocate Encounter   Received notification from CoverMyMeds that prior authorization for Zepbound  7.5MG /0.5ML pen-injectors is due for renewal.   Insurance verification completed.   The patient is insured through [employer].  Action: Medication has been discontinued. Archived Key: [id number]  **Patient is on higher dose now.**

## 2023-12-30 DIAGNOSIS — L659 Nonscarring hair loss, unspecified: Secondary | ICD-10-CM | POA: Diagnosis not present

## 2023-12-30 DIAGNOSIS — E890 Postprocedural hypothyroidism: Secondary | ICD-10-CM | POA: Diagnosis not present

## 2024-01-07 DIAGNOSIS — M25562 Pain in left knee: Secondary | ICD-10-CM | POA: Diagnosis not present

## 2024-01-13 DIAGNOSIS — M25562 Pain in left knee: Secondary | ICD-10-CM | POA: Diagnosis not present

## 2024-01-22 ENCOUNTER — Telehealth: Payer: Self-pay

## 2024-01-22 ENCOUNTER — Other Ambulatory Visit (HOSPITAL_COMMUNITY): Payer: Self-pay

## 2024-01-22 ENCOUNTER — Other Ambulatory Visit: Payer: Self-pay

## 2024-01-22 NOTE — Telephone Encounter (Signed)
 Pharmacy Patient Advocate Encounter   Received notification from Onbase that prior authorization for Zepbound  12.5MG /0.5ML pen-injectors  is required/requested.   Insurance verification completed.   The patient is insured through [employer].   Per test claim: PA required; PA submitted to above mentioned insurance via Latent Key/confirmation #/EOC [id number] Status is pending

## 2024-01-23 ENCOUNTER — Ambulatory Visit (INDEPENDENT_AMBULATORY_CARE_PROVIDER_SITE_OTHER): Admitting: Dermatology

## 2024-01-23 ENCOUNTER — Encounter: Payer: Self-pay | Admitting: Dermatology

## 2024-01-23 VITALS — BP 110/83

## 2024-01-23 DIAGNOSIS — D485 Neoplasm of uncertain behavior of skin: Secondary | ICD-10-CM

## 2024-01-23 DIAGNOSIS — E039 Hypothyroidism, unspecified: Secondary | ICD-10-CM | POA: Diagnosis not present

## 2024-01-23 DIAGNOSIS — D225 Melanocytic nevi of trunk: Secondary | ICD-10-CM | POA: Diagnosis not present

## 2024-01-23 DIAGNOSIS — L649 Androgenic alopecia, unspecified: Secondary | ICD-10-CM | POA: Diagnosis not present

## 2024-01-23 DIAGNOSIS — L905 Scar conditions and fibrosis of skin: Secondary | ICD-10-CM | POA: Diagnosis not present

## 2024-01-23 MED ORDER — MINOXIDIL 2.5 MG PO TABS: ORAL_TABLET | ORAL | 11 refills | Status: AC

## 2024-01-23 NOTE — Patient Instructions (Addendum)

## 2024-01-23 NOTE — Progress Notes (Signed)
 New Patient Visit   Subjective  Illana Newman is a 33 y.o. female who presents for a NEW PATIENT appointment to be examined for the concerns as listed below.  Patient (and/or pt guardian) consented to the use of AI-assisted tools for note generation.   Hair Thinning: Pt stated that she was Dx with a thyroid  issue about 8 years ago and had it removed this January. She stated since the removal her hair thinning has gotten worse. She has tried topical minoxidil, biotin(9yrs), & Nutrafol(58mo) but seen no improvement so she D/C. She is currently on oral minoxidil 2.5mg  that she has taken for 19mo.   Add On: Itchy spot on the back that rated 8/10. Spot presented within the last few months and her boyfriend noticed it.    Are you nursing, pregnant or trying to conceive? no   The following portions of the chart were reviewed this encounter and updated as appropriate: medications, allergies, medical history  Review of Systems:  No other skin or systemic complaints except as noted in HPI or Assessment and Plan.  Objective  Well appearing patient in no apparent distress; mood and affect are within normal limits.   A focused examination was performed of the following areas: scalp   Relevant exam findings are noted in the Assessment and Plan.             Mid Back 1.5x6cm irregular brown papule  Assessment & Plan   Androgenetic alopecia with diffuse hair thinning Hair thinning is multifactorial, with androgenetic alopecia being prominent. Thyroid  issues post-thyroidectomy contribute to hair thinning. Oral minoxidil initiated two months ago; results expected in 3-4 months. No significant side effects anticipated at current dose. Collagen supplements recommended for hair strengthening. Nutrafol and Viviscal discussed as potential supplements, with Nutrafol being more expensive but containing biotin. - Continue oral minoxidil 2.5 mg, take half a tablet daily. - Prescribed minoxidil  with a three-month supply and three refills. - Recommended collagen supplements, such as Vital Proteins. - Consider Nutrafol or Viviscal if no improvement in four months. - Recommended Colleen Rothschild hair mask for hair strengthening.  Hypothyroidism post-thyroidectomy Post-thyroidectomy hypothyroidism managed with levothyroxine. Recent dose adjustment to 137 mcg. Thyroid  levels are being monitored and adjusted to maintain steady state. - Continue levothyroxine 137 mcg daily.  Atypical nevus, left back, for shave removal New atypical nevus on the left back, likely exacerbated by recent sunburn. Itching present, possibly due to sunburn. Removal recommended due to size and shape. - Shave removal the atypical nevus on the left back.  Obesity treated with tirzepatide  Obesity managed with tirzepatide  (Zepbound ) since February. No significant increase in hair shedding noted after starting tirzepatide . - Continue tirzepatide  as prescribed. NEOPLASM OF UNCERTAIN BEHAVIOR OF SKIN Mid Back Skin / nail biopsy Type of biopsy: tangential   Informed consent: discussed and consent obtained   Timeout: patient name, date of birth, surgical site, and procedure verified   Procedure prep:  Patient was prepped and draped in usual sterile fashion Prep type:  Isopropyl alcohol Anesthesia: the lesion was anesthetized in a standard fashion   Anesthetic:  1% lidocaine  w/ epinephrine 1-100,000 buffered w/ 8.4% NaHCO3 Instrument used: DermaBlade   Hemostasis achieved with: aluminum chloride   Outcome: patient tolerated procedure well   Post-procedure details: sterile dressing applied and wound care instructions given   Dressing type: petrolatum gauze and bandage    Specimen 1 - Surgical pathology Differential Diagnosis: r/o DN   Check Margins: yes  Return in about 4  months (around 05/22/2024) for ANDROGENETIC ALOPECIA.   Documentation: I have reviewed the above documentation for accuracy and  completeness, and I agree with the above.  I, Shirron Maranda, CMA, am acting as scribe for [employer], DO.   Delon Lenis, DO

## 2024-01-24 ENCOUNTER — Encounter: Payer: Self-pay | Admitting: Nurse Practitioner

## 2024-01-27 LAB — SURGICAL PATHOLOGY

## 2024-01-28 ENCOUNTER — Ambulatory Visit: Payer: Self-pay | Admitting: Dermatology

## 2024-01-30 ENCOUNTER — Other Ambulatory Visit (HOSPITAL_COMMUNITY): Payer: Self-pay

## 2024-01-30 NOTE — Telephone Encounter (Signed)
 Pharmacy Patient Advocate Encounter  Received notification from [facility] [employer] that Prior Authorization for  Zepbound  12.5MG /0.5ML pen-injectors  has been APPROVED from 12/23/23 to 01/28/25. Unable to obtain price due to refill too soon rejection, last fill date 01/29/24 next available fill date12/11/25   PA #/Case ID/Reference #: [id number]

## 2024-03-15 ENCOUNTER — Encounter: Payer: Self-pay | Admitting: Nurse Practitioner

## 2024-03-15 DIAGNOSIS — E663 Overweight: Secondary | ICD-10-CM

## 2024-03-16 MED ORDER — ZEPBOUND 12.5 MG/0.5ML ~~LOC~~ SOAJ: 12.5000 mg | SUBCUTANEOUS | 0 refills | Status: AC

## 2024-03-17 ENCOUNTER — Ambulatory Visit: Admitting: Nurse Practitioner

## 2024-04-23 ENCOUNTER — Ambulatory Visit: Admitting: Nurse Practitioner

## 2024-06-10 ENCOUNTER — Ambulatory Visit: Admitting: Dermatology
